# Patient Record
Sex: Female | Born: 1990 | Race: White | Hispanic: No
Health system: Southern US, Academic
[De-identification: ages and names within clinical notes are randomized; demographics above are authoritative.]

## PROBLEM LIST (undated history)

## (undated) ENCOUNTER — Telehealth
Attending: Student in an Organized Health Care Education/Training Program | Primary: Student in an Organized Health Care Education/Training Program

## (undated) ENCOUNTER — Ambulatory Visit

## (undated) ENCOUNTER — Telehealth

## (undated) ENCOUNTER — Ambulatory Visit: Payer: PRIVATE HEALTH INSURANCE

## (undated) ENCOUNTER — Telehealth: Attending: Family Medicine | Primary: Family Medicine

## (undated) ENCOUNTER — Telehealth
Attending: Pharmacist Clinician (PhC)/ Clinical Pharmacy Specialist | Primary: Pharmacist Clinician (PhC)/ Clinical Pharmacy Specialist

## (undated) ENCOUNTER — Ambulatory Visit
Payer: PRIVATE HEALTH INSURANCE | Attending: Student in an Organized Health Care Education/Training Program | Primary: Student in an Organized Health Care Education/Training Program

## (undated) ENCOUNTER — Encounter

## (undated) ENCOUNTER — Encounter
Attending: Student in an Organized Health Care Education/Training Program | Primary: Student in an Organized Health Care Education/Training Program

## (undated) ENCOUNTER — Ambulatory Visit: Payer: MEDICAID

## (undated) ENCOUNTER — Ambulatory Visit: Payer: Medicaid (Managed Care)

## (undated) DIAGNOSIS — Z789 Other specified health status: Secondary | ICD-10-CM

## (undated) DIAGNOSIS — O119 Pre-existing hypertension with pre-eclampsia, unspecified trimester: Secondary | ICD-10-CM

## (undated) HISTORY — DX: Pre-existing hypertension with pre-eclampsia, unspecified trimester: O11.9

---
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
## 2011-08-18 HISTORY — PX: WISDOM TOOTH EXTRACTION: SHX21

## 2013-01-15 ENCOUNTER — Emergency Department: Payer: Self-pay | Admitting: Psychiatry

## 2014-09-06 ENCOUNTER — Emergency Department: Payer: Self-pay | Admitting: Emergency Medicine

## 2014-10-16 ENCOUNTER — Emergency Department: Payer: Self-pay | Admitting: Emergency Medicine

## 2015-08-18 NOTE — L&D Delivery Note (Signed)
Delivery Note  First Stage: Labor induction: Cervidil on 08/15/16 for pre-eclampsia  Labor onset: 08/16/16: 0630 Augmentation: none Analgesia Eliezer Lofts/Anesthesia intrapartum: Epidural  SROM at 0620  Second Stage: Complete dilation at 1211 Onset of pushing at 1216 FHR second stage: Category 2, Baseline: 130 bpm/ moderate variability/ +accels/ variable decels to 90 bpm with each contraction and good return to baseline   Delivery of a viable female "Sohpia" at 1257 by Carlean JewsMeredith Jameer Storie, CNM in ROA position, no nuchal cord - the anterior shoulder easily delivered, followed by the posterior shoulder and body.  Baby was brought to mom's abdomen vigorous and crying, and dried on mom's abdomen. Small amount of terminal meconium noted.  Cord double clamped after cessation of pulsation, cut by FOB Cord blood sample collected  Third Stage: Placenta delivered via Tomasa BlaseSchultz intact with 3 VC @ 1318 Placenta disposition: hospital disposal  Uterine tone firm with massage - IV Pitocin 500mL bolus infusing / bleeding brisk with a small trickle - assessed for cervical laceration and none identified - 2 medium clots expressed and trickle resolved   Right labial laceration identified, 1 interrupted suture placed with good hemostasis  Anesthesia for repair: Epidural, 1% Lidocaine 30mL Repair: 3.0  Est. Blood Loss (mL): 400mL  Complications: none  Mom to postpartum.  Baby to Couplet care / Skin to Skin.  Newborn: Birth Weight: 7#4oz Apgar Scores: 8, 9 Feeding planned: Formula   Carlean JewsMeredith Kian Gamarra, CNM

## 2016-02-25 LAB — OB RESULTS CONSOLE VARICELLA ZOSTER ANTIBODY, IGG: VARICELLA IGG: IMMUNE

## 2016-02-25 LAB — OB RESULTS CONSOLE RPR: RPR: NONREACTIVE

## 2016-02-25 LAB — OB RESULTS CONSOLE HEPATITIS B SURFACE ANTIGEN: Hepatitis B Surface Ag: NEGATIVE

## 2016-02-25 LAB — OB RESULTS CONSOLE HIV ANTIBODY (ROUTINE TESTING): HIV: NONREACTIVE

## 2016-02-25 LAB — OB RESULTS CONSOLE RUBELLA ANTIBODY, IGM: RUBELLA: IMMUNE

## 2016-02-27 ENCOUNTER — Other Ambulatory Visit: Payer: Self-pay | Admitting: Obstetrics and Gynecology

## 2016-02-27 DIAGNOSIS — Z369 Encounter for antenatal screening, unspecified: Secondary | ICD-10-CM

## 2016-03-16 ENCOUNTER — Other Ambulatory Visit: Payer: Self-pay | Admitting: Obstetrics and Gynecology

## 2016-03-16 ENCOUNTER — Ambulatory Visit
Admission: RE | Admit: 2016-03-16 | Discharge: 2016-03-16 | Disposition: A | Discharge status: Home / Self Care | Payer: Medicaid Other | Source: Ambulatory Visit | Attending: Obstetrics and Gynecology | Admitting: Obstetrics and Gynecology

## 2016-03-16 ENCOUNTER — Ambulatory Visit (HOSPITAL_BASED_OUTPATIENT_CLINIC_OR_DEPARTMENT_OTHER)
Admission: RE | Admit: 2016-03-16 | Discharge: 2016-03-16 | Disposition: A | Discharge status: Home / Self Care | Payer: Medicaid Other | Source: Ambulatory Visit | Attending: Obstetrics and Gynecology | Admitting: Obstetrics and Gynecology

## 2016-03-16 DIAGNOSIS — Z3A17 17 weeks gestation of pregnancy: Secondary | ICD-10-CM | POA: Diagnosis not present

## 2016-03-16 DIAGNOSIS — Z3492 Encounter for supervision of normal pregnancy, unspecified, second trimester: Secondary | ICD-10-CM

## 2016-03-16 DIAGNOSIS — O99311 Alcohol use complicating pregnancy, first trimester: Secondary | ICD-10-CM | POA: Insufficient documentation

## 2016-03-16 DIAGNOSIS — Z369 Encounter for antenatal screening, unspecified: Secondary | ICD-10-CM

## 2016-03-16 DIAGNOSIS — F17200 Nicotine dependence, unspecified, uncomplicated: Secondary | ICD-10-CM

## 2016-03-16 DIAGNOSIS — Z720 Tobacco use: Secondary | ICD-10-CM

## 2016-03-16 DIAGNOSIS — F1990 Other psychoactive substance use, unspecified, uncomplicated: Secondary | ICD-10-CM

## 2016-03-16 HISTORY — DX: Other specified health status: Z78.9

## 2016-03-16 NOTE — Progress Notes (Addendum)
Referring physician:  [facility] Length of Consultation: 45 minutes   Sally Kelley  was referred to [facility] for genetic counseling to review prenatal screening and testing options.  This note summarizes the information we discussed.    We offered the following routine screening tests for this pregnancy:  First trimester screening, which includes nuchal translucency ultrasound screen and first trimester maternal serum marker screening.  The nuchal translucency has approximately an 80% detection rate for Down syndrome and can be positive for other chromosome abnormalities as well as congenital heart defects.  When combined with a maternal serum marker screening, the detection rate is up to 90% for Down syndrome and up to 97% for trisomy 18.     Maternal serum marker screening, a blood test that measures pregnancy proteins, can provide risk assessments for Down syndrome, trisomy 18, and open neural tube defects (spina bifida, anencephaly). Because it does not directly examine the fetus, it cannot positively diagnose or rule out these problems.  Targeted ultrasound uses high frequency sound waves to create an image of the developing fetus.  An ultrasound is often recommended as a routine means of evaluating the pregnancy.  It is also used to screen for fetal anatomy problems (for example, a heart defect) that might be suggestive of a chromosomal or other abnormality.   Should these screening tests indicate an increased concern, then the following additional testing options would be offered:  The chorionic villus sampling procedure is available for first trimester chromosome analysis.  This involves the withdrawal of a small amount of chorionic villi (tissue from the developing placenta).  Risk of pregnancy loss is estimated to be approximately 1 in 200 to 1 in 100 (0.5 to 1%).  There is approximately a 1% (1 in 100) chance that the CVS chromosome results will be unclear.   Chorionic villi cannot be tested for neural tube defects.     Amniocentesis involves the removal of a small amount of amniotic fluid from the sac surrounding the fetus with the use of a thin needle inserted through the maternal abdomen and uterus.  Ultrasound guidance is used throughout the procedure.  Fetal cells from amniotic fluid are directly evaluated and > 99.5% of chromosome problems and > 98% of open neural tube defects can be detected. This procedure is generally performed after the 15th week of pregnancy.  The main risks to this procedure include complications leading to miscarriage in less than 1 in 200 cases (0.5%).  As another option for information if the pregnancy is suspected to be an an increased chance for certain chromosome conditions, we also reviewed the availability of cell free fetal DNA testing from maternal blood to determine whether or not the baby may have either Down syndrome, trisomy 7, or trisomy 29.  This test utilizes a maternal blood sample and DNA sequencing technology to isolate circulating cell free fetal DNA from maternal plasma.  The fetal DNA can then be analyzed for DNA sequences that are derived from the three most common chromosomes involved in aneuploidy, chromosomes 13, 18, and 21.  If the overall amount of DNA is greater than the expected level for any of these chromosomes, aneuploidy is suspected.  While we do not consider it a replacement for invasive testing and karyotype analysis, a negative result from this testing would be reassuring, though not a guarantee of a normal chromosome complement for the baby.  An abnormal result is certainly suggestive of an abnormal chromosome complement, though  we would still recommend CVS or amniocentesis to confirm any findings from this testing.  Cystic Fibrosis and Spinal Muscular Atrophy (SMA) screening were also discussed with the patient. Both conditions are recessive, which means that both parents must be carriers in  order to have a child with the disease.  Cystic fibrosis (CF) is one of the most common genetic conditions in persons of Caucasian ancestry.  This condition occurs in approximately 1 in 2,500 Caucasian persons and results in thickened secretions in the lungs, digestive, and reproductive systems.  For a baby to be at risk for having CF, both of the parents must be carriers for this condition.  Approximately 1 in 88 Caucasian persons is a carrier for CF.  Current carrier testing looks for the most common mutations in the gene for CF and can detect approximately 90% of carriers in the Caucasian population.  This means that the carrier screening can greatly reduce, but cannot eliminate, the chance for an individual to have a child with CF.  If an individual is found to be a carrier for CF, then carrier testing would be available for the partner. As part of [place] newborn screening profile, all babies born in the state of [place] will have a two-tier screening process.  Specimens are first tested to determine the concentration of immunoreactive trypsinogen (IRT).  The top 5% of specimens with the highest IRT values then undergo DNA testing using a panel of over 40 common CF mutations. SMA is a neurodegenerative disorder that leads to atrophy of skeletal muscle and overall weakness.  This condition is also more prevalent in the Caucasian population, with 1 in 40-1 in 60 persons being a carrier and 1 in 6,000-1 in 10,000 children being affected.  There are multiple forms of the disease, with some causing death in infancy to other forms with survival into adulthood.  The genetics of SMA is complex, but carrier screening can detect up to 95% of carriers in the Caucasian population.  Similar to CF, a negative result can greatly reduce, but cannot eliminate, the chance to have a child with SMA.  We obtained a detailed family history and pregnancy history.  The family history was reported to be unremarkable  for birth defects, mental retardation, recurrent pregnancy loss or known chromosome abnormalities.  Sally Kelley stated that this is her second pregnancy.  The first was terminated due to personal reasons.  She reported no complications in the current pregnancy.  However, she did report drinking alcohol and smoking at least once per week and smoking marijuana multiple times each week.  Alcohol consumption during pregnancy has been associated with a number of birth defects including growth delays, small head size, heart defects, eye anomalies and facial differences as well as learning disabilities and behavioral problems.  The risk of these to occur tends to increase with the amount of alcohol consumed, however, malformations have been seen with as little as two drinks per day.  Because there is no safe amount of alcohol consumption during pregnancy, we suggest women completely avoid alcohol while pregnant.  A level 2 ultrasound and fetal echocardiogram (a detailed ultrasound of the fetal heart after 20 weeks) may help to detect growth delays or birth defects associated with alcohol use.  However, it is important to remember that not all birth defects can be identified prenatally. The use of marijuana in pregnancy is known to be associated with low birth weight and premature delivery.  We therefore suggested the patient avoid  smoking marijuana during this time. We also discussed that smoking during pregnancy has been associated with low birth weight, premature delivery and pregnancy loss.  For this reason, we suggest that she cut back or avoid smoking cigarettes for the remainder of the pregnancy.  After consideration of the options, Sally Kelley elected to proceed with first trimester screening.  However, the ultrasound revealed her to be [redacted] weeks gestation.  She then elected to proceed with maternal serum screening.  An ultrasound was performed at the time of the visit.  The gestational age was consistent with   72 weeks.  Routine fetal anatomy was seen and appeared normal except for the posterior fossa and spine which were suboptimally seen.  Please refer to the ultrasound report for details of that study.  Sally Kelley was encouraged to call with questions or concerns.  We can be contacted at [phone].   Wilburt Finlay, MS, CGC   I met with Ms Sally Kelley and reviewed that visualized anatomy was normal, I scheduled an additional scan to complete the anatomy checklist . Patient seemed motivated to eliminate her substance use and is relieved that a healthy pregnancy is seen . I reviewed that not all problems can be seen on ultrasound.  Gatha Mayer, MD

## 2016-03-19 ENCOUNTER — Telehealth: Payer: Self-pay | Admitting: Obstetrics and Gynecology

## 2016-03-19 NOTE — Telephone Encounter (Signed)
Ms. Sally Kelley elected to proceed with maternal serum screening at her visit with [facility] of Monday, 03/16/2016.  The results are normal with a chance of 1 in 10,000 for open neural tube defect; 1 in 4,316 for Down syndrome and 1 in 3,847 for trisomy 18.  The patient was informed of these results.  Cherly Anderson, MS, CGC

## 2016-04-13 ENCOUNTER — Ambulatory Visit: Payer: Medicaid Other

## 2016-04-29 LAB — AFP, QUAD SCREEN
AFP MoM: 1.05
AFP: 43.7 ng/mL
DIA MOM VALUE: 1.37
DIA VALUE (EIA): 234.5 pg/mL
DSR (BY AGE) 1 IN: 987
Down Syndrome Scr Risk Est: 4316
Gest. Age on Collection Date: 17.9 WEEKS
HCG MOM: 0.95
HCG TOTAL: 26796 m[IU]/mL
MATERNAL AGE AT EDD: 25.9 a
MATERNAL WT: 155 [lb_av]
OSB INTERPRETATION: 10000
Test Results:: NEGATIVE
Tri 18 Scr Risk Est: 1:3847 {titer}
UE3 MOM: 1.31
UE3 VALUE: 1.68 ng/mL

## 2016-05-04 ENCOUNTER — Ambulatory Visit
Admission: RE | Admit: 2016-05-04 | Discharge: 2016-05-04 | Disposition: A | Discharge status: Home / Self Care | Payer: Medicaid Other | Source: Ambulatory Visit | Attending: Maternal and Fetal Medicine | Admitting: Maternal and Fetal Medicine

## 2016-05-04 DIAGNOSIS — Z36 Encounter for antenatal screening of mother: Secondary | ICD-10-CM | POA: Insufficient documentation

## 2016-05-04 DIAGNOSIS — O99332 Smoking (tobacco) complicating pregnancy, second trimester: Secondary | ICD-10-CM | POA: Insufficient documentation

## 2016-05-04 DIAGNOSIS — O99311 Alcohol use complicating pregnancy, first trimester: Secondary | ICD-10-CM

## 2016-05-04 DIAGNOSIS — Z3A24 24 weeks gestation of pregnancy: Secondary | ICD-10-CM | POA: Insufficient documentation

## 2016-05-04 DIAGNOSIS — F1990 Other psychoactive substance use, unspecified, uncomplicated: Secondary | ICD-10-CM

## 2016-05-04 DIAGNOSIS — F17200 Nicotine dependence, unspecified, uncomplicated: Secondary | ICD-10-CM

## 2016-07-31 LAB — OB RESULTS CONSOLE GC/CHLAMYDIA
Chlamydia: NEGATIVE
Gonorrhea: NEGATIVE

## 2016-07-31 LAB — OB RESULTS CONSOLE GBS: GBS: NEGATIVE

## 2016-08-14 ENCOUNTER — Inpatient Hospital Stay
Admission: RE | Admit: 2016-08-14 | Discharge: 2016-08-17 | DRG: 775 | Disposition: A | Discharge status: Home / Self Care | Payer: Medicaid Other | Source: Ambulatory Visit | Attending: Obstetrics and Gynecology | Admitting: Obstetrics and Gynecology

## 2016-08-14 DIAGNOSIS — F17210 Nicotine dependence, cigarettes, uncomplicated: Secondary | ICD-10-CM | POA: Diagnosis present

## 2016-08-14 DIAGNOSIS — O1404 Mild to moderate pre-eclampsia, complicating childbirth: Principal | ICD-10-CM | POA: Diagnosis present

## 2016-08-14 DIAGNOSIS — O99334 Smoking (tobacco) complicating childbirth: Secondary | ICD-10-CM | POA: Diagnosis present

## 2016-08-14 DIAGNOSIS — O1493 Unspecified pre-eclampsia, third trimester: Secondary | ICD-10-CM | POA: Diagnosis present

## 2016-08-14 DIAGNOSIS — F1290 Cannabis use, unspecified, uncomplicated: Secondary | ICD-10-CM | POA: Diagnosis present

## 2016-08-14 DIAGNOSIS — O99324 Drug use complicating childbirth: Secondary | ICD-10-CM | POA: Diagnosis present

## 2016-08-14 DIAGNOSIS — Z3A39 39 weeks gestation of pregnancy: Secondary | ICD-10-CM

## 2016-08-14 LAB — COMPREHENSIVE METABOLIC PANEL
ALT: 10 U/L — ABNORMAL LOW (ref 14–54)
ANION GAP: 10 (ref 5–15)
AST: 21 U/L (ref 15–41)
Albumin: 3 g/dL — ABNORMAL LOW (ref 3.5–5.0)
Alkaline Phosphatase: 113 U/L (ref 38–126)
BILIRUBIN TOTAL: 0.4 mg/dL (ref 0.3–1.2)
BUN: 10 mg/dL (ref 6–20)
CHLORIDE: 103 mmol/L (ref 101–111)
CO2: 20 mmol/L — ABNORMAL LOW (ref 22–32)
Calcium: 9.4 mg/dL (ref 8.9–10.3)
Creatinine, Ser: 0.49 mg/dL (ref 0.44–1.00)
Glucose, Bld: 100 mg/dL — ABNORMAL HIGH (ref 65–99)
POTASSIUM: 3.5 mmol/L (ref 3.5–5.1)
Sodium: 133 mmol/L — ABNORMAL LOW (ref 135–145)
TOTAL PROTEIN: 6.6 g/dL (ref 6.5–8.1)

## 2016-08-14 LAB — CBC
HEMATOCRIT: 36.3 % (ref 35.0–47.0)
HEMOGLOBIN: 12.7 g/dL (ref 12.0–16.0)
MCH: 31.7 pg (ref 26.0–34.0)
MCHC: 35 g/dL (ref 32.0–36.0)
MCV: 90.5 fL (ref 80.0–100.0)
Platelets: 243 10*3/uL (ref 150–440)
RBC: 4.01 MIL/uL (ref 3.80–5.20)
RDW: 13.6 % (ref 11.5–14.5)
WBC: 12.8 10*3/uL — AB (ref 3.6–11.0)

## 2016-08-14 MED ORDER — HYDRALAZINE HCL 20 MG/ML IJ SOLN: 10.0000 mg | Freq: Once | INTRAMUSCULAR | Status: DC | PRN

## 2016-08-14 MED ORDER — LABETALOL HCL 5 MG/ML IV SOLN
20.0000 mg | INTRAVENOUS | Status: DC | PRN
Start: 2016-08-14 — End: 2016-08-17
  Filled 2016-08-14: qty 16

## 2016-08-14 NOTE — H&P (Signed)
Sally Kelley is a 25 y.o. female G2P0010 at 39+2 weeks presenting for iol for PreE without severe features.  Patient's last menstrual period was 12/11/2015 (exact date). Estimated Date of Delivery: 08/19/16  Patient concerns today:  Denies HA SOB change vision and RUQ/epigastric pain.  Pt denies vaginal bleeding, leaking fluid, decreased fetal movement.   BP (!) 128/92   Pulse 85   Ht 162.6 cm (5' 4.02")   Wt 78.6 kg (173 lb 3.2 oz)   LMP 12/11/2015 (Exact Date)   BMI 29.71 kg/m   OB History    Gravida Para Term Preterm AB Living   2       1 1    SAB TAB Ectopic Multiple Live Births                 Past Medical History:  Diagnosis Date  . Medical history non-contributory    Past Surgical History:  Procedure Laterality Date  . INDUCED ABORTION  2011  . WISDOM TOOTH EXTRACTION Left 2013   Family History: family history is not on file. Social History:  reports that she has been smoking Cigarettes.  She has been smoking about 0.25 packs per day. She has never used smokeless tobacco. She reports that she uses drugs, including Marijuana. She reports that she does not drink alcohol.     Maternal Diabetes: No Genetic Screening: Normal Maternal Ultrasounds/Referrals: Normal Fetal Ultrasounds or other Referrals:  None Maternal Substance Abuse:  No Significant Maternal Medications:  None Significant Maternal Lab Results:  None Other Comments:  None  Review of Systems  Constitutional: Negative for chills and fever.  Eyes: Negative for blurred vision and double vision.  Respiratory: Negative for shortness of breath.   Cardiovascular: Negative for chest pain and palpitations.  Gastrointestinal: Negative for abdominal pain, constipation, diarrhea, nausea and vomiting.  Genitourinary: Negative for dysuria, flank pain, frequency and urgency.  Neurological: Negative for headaches.  Psychiatric/Behavioral: Negative for depression.   Maternal Medical History:  Reason for  admission: Nausea.      Blood pressure (!) 141/87, pulse 95, temperature 98.6 F (37 C), temperature source Oral, resp. rate 18, SpO2 99 %. Exam Physical Exam  Constitutional: She is oriented to person, place, and time. She appears well-developed and well-nourished. No distress.  Eyes: No scleral icterus.  Neck: Normal range of motion. Neck supple.  Cardiovascular: Normal rate.   Respiratory: Effort normal. No respiratory distress.  GI: Soft. She exhibits no distension. There is no tenderness.  Genitourinary: Vagina normal and uterus normal.  Musculoskeletal: Normal range of motion.  Neurological: She is alert and oriented to person, place, and time.  Skin: Skin is warm and dry.  Psychiatric: She has a normal mood and affect.    Prenatal labs: MBT O+ Ab screen Neg Pap HIV Neg Hep B/RPR Neg/NR Rubella Immune VZV Immune  Assessment/Plan:  25 y.o. G2P0010 AT 2744w2d with PreE with mild features Explained to patient that she has preeclampsia, with 2 >90 DBP >4hrs apart and PC ratio of >0.3 (.9).   - Admit to hospital for serial blood pressures in motherbaby floor and then IOL when a room on L&D becomes available. - serial BP - PreE labs pending - Fetal monitoring per shift - to L&D when available. Plan for pitocin as cervix already 2-3 cm; recheck on transfer. - severe range pressures: treat with iv labetalol per protocol. Orders in     [place]waterBEASLEY, Sally Irion 08/14/2016, 7:26 PM

## 2016-08-15 DIAGNOSIS — F17210 Nicotine dependence, cigarettes, uncomplicated: Secondary | ICD-10-CM | POA: Diagnosis present

## 2016-08-15 DIAGNOSIS — O99324 Drug use complicating childbirth: Secondary | ICD-10-CM | POA: Diagnosis present

## 2016-08-15 DIAGNOSIS — F1290 Cannabis use, unspecified, uncomplicated: Secondary | ICD-10-CM | POA: Diagnosis present

## 2016-08-15 DIAGNOSIS — Z3A39 39 weeks gestation of pregnancy: Secondary | ICD-10-CM | POA: Diagnosis not present

## 2016-08-15 DIAGNOSIS — O99334 Smoking (tobacco) complicating childbirth: Secondary | ICD-10-CM | POA: Diagnosis present

## 2016-08-15 DIAGNOSIS — O1404 Mild to moderate pre-eclampsia, complicating childbirth: Secondary | ICD-10-CM | POA: Diagnosis present

## 2016-08-15 DIAGNOSIS — O1494 Unspecified pre-eclampsia, complicating childbirth: Secondary | ICD-10-CM | POA: Diagnosis present

## 2016-08-15 LAB — BASIC METABOLIC PANEL
Anion gap: 10 (ref 5–15)
BUN: 9 mg/dL (ref 6–20)
CALCIUM: 9 mg/dL (ref 8.9–10.3)
CO2: 21 mmol/L — AB (ref 22–32)
CREATININE: 0.65 mg/dL (ref 0.44–1.00)
Chloride: 104 mmol/L (ref 101–111)
GFR calc Af Amer: >60 mL/min (ref >60)
GFR calc non Af Amer: >60 mL/min (ref >60)
GLUCOSE: 105 mg/dL — AB (ref 65–99)
Potassium: 3.3 mmol/L — ABNORMAL LOW (ref 3.5–5.1)
Sodium: 135 mmol/L (ref 135–145)

## 2016-08-15 LAB — PROTEIN / CREATININE RATIO, URINE
CREATININE, URINE: 68 mg/dL
PROTEIN CREATININE RATIO: 0.21 mg/mg{creat} — AB (ref 0.00–0.15)
TOTAL PROTEIN, URINE: 14 mg/dL

## 2016-08-15 MED ORDER — ZOLPIDEM TARTRATE 5 MG PO TABS
5.0000 mg | ORAL_TABLET | Freq: Every evening | ORAL | Status: DC | PRN
  Administered 2016-08-15: 5 mg via ORAL
  Filled 2016-08-15: qty 1

## 2016-08-15 MED ORDER — SODIUM CHLORIDE FLUSH 0.9 % IV SOLN
INTRAVENOUS | Status: AC
  Administered 2016-08-15: 21:00:00
  Filled 2016-08-15: qty 20

## 2016-08-15 MED ORDER — LACTATED RINGERS IV SOLN
INTRAVENOUS | Status: DC
  Administered 2016-08-16: 06:00:00 via INTRAVENOUS

## 2016-08-15 MED ORDER — DINOPROSTONE 10 MG VA INST
10.0000 mg | VAGINAL_INSERT | Freq: Once | VAGINAL | Status: AC
  Administered 2016-08-15: 10 mg via VAGINAL
  Filled 2016-08-15: qty 1

## 2016-08-15 NOTE — Progress Notes (Signed)
Patient ID: [name], female   DOB: 01/24/1991, 25 y.o.   MRN: [id number] Pt with Mild preeclampsia and is scheduled for induction on L+D . All rooms full . Pt with scotomata or h/a this am .  O: 119/69 Lungs cat  cv rrr  A; PIH  P: induction today well room opens  Light meal before going over

## 2016-08-15 NOTE — Progress Notes (Signed)
Will bring to L+D tonight for cervidil induction  VSS stable  Repeat labs in am

## 2016-08-16 ENCOUNTER — Inpatient Hospital Stay: Payer: Medicaid Other | Admitting: Anesthesiology

## 2016-08-16 ENCOUNTER — Encounter: Payer: Self-pay | Admitting: Obstetrics and Gynecology

## 2016-08-16 DIAGNOSIS — O1493 Unspecified pre-eclampsia, third trimester: Secondary | ICD-10-CM | POA: Diagnosis present

## 2016-08-16 LAB — CBC
HEMATOCRIT: 36.3 % (ref 35.0–47.0)
HEMOGLOBIN: 12.8 g/dL (ref 12.0–16.0)
MCH: 31.9 pg (ref 26.0–34.0)
MCHC: 35.3 g/dL (ref 32.0–36.0)
MCV: 90.6 fL (ref 80.0–100.0)
Platelets: 230 10*3/uL (ref 150–440)
RBC: 4.01 MIL/uL (ref 3.80–5.20)
RDW: 13.4 % (ref 11.5–14.5)
WBC: 12.3 10*3/uL — ABNORMAL HIGH (ref 3.6–11.0)

## 2016-08-16 LAB — URINE DRUG SCREEN, QUALITATIVE (ARMC ONLY)
Amphetamines, Ur Screen: NOT DETECTED
BARBITURATES, UR SCREEN: NOT DETECTED
BENZODIAZEPINE, UR SCRN: NOT DETECTED
Cannabinoid 50 Ng, Ur ~~LOC~~: POSITIVE — AB
Cocaine Metabolite,Ur ~~LOC~~: NOT DETECTED
MDMA (Ecstasy)Ur Screen: NOT DETECTED
METHADONE SCREEN, URINE: NOT DETECTED
OPIATE, UR SCREEN: NOT DETECTED
PHENCYCLIDINE (PCP) UR S: NOT DETECTED
Tricyclic, Ur Screen: NOT DETECTED

## 2016-08-16 LAB — TYPE AND SCREEN
ABO/RH(D): O POS
Antibody Screen: NEGATIVE

## 2016-08-16 MED ORDER — ZOLPIDEM TARTRATE 5 MG PO TABS: 5.0000 mg | ORAL_TABLET | Freq: Every evening | ORAL | Status: DC | PRN

## 2016-08-16 MED ORDER — ACETAMINOPHEN 325 MG PO TABS: 650.0000 mg | ORAL_TABLET | ORAL | Status: DC | PRN

## 2016-08-16 MED ORDER — BUTORPHANOL TARTRATE 1 MG/ML IJ SOLN: 1.0000 mg | INTRAMUSCULAR | Status: DC | PRN

## 2016-08-16 MED ORDER — OXYTOCIN BOLUS FROM INFUSION
500.0000 mL | Freq: Once | INTRAVENOUS | Status: AC
  Administered 2016-08-16: 500 mL via INTRAVENOUS

## 2016-08-16 MED ORDER — OXYCODONE-ACETAMINOPHEN 5-325 MG PO TABS: 2.0000 | ORAL_TABLET | ORAL | Status: DC | PRN

## 2016-08-16 MED ORDER — PRENATAL MULTIVITAMIN CH
1.0000 | ORAL_TABLET | Freq: Every day | ORAL | Status: DC
  Administered 2016-08-16 – 2016-08-17 (×2): 1 via ORAL
  Filled 2016-08-16 (×3): qty 1

## 2016-08-16 MED ORDER — FERROUS SULFATE 325 (65 FE) MG PO TABS
325.0000 mg | ORAL_TABLET | Freq: Two times a day (BID) | ORAL | Status: DC
  Administered 2016-08-16 – 2016-08-17 (×2): 325 mg via ORAL
  Filled 2016-08-16 (×2): qty 1

## 2016-08-16 MED ORDER — DIBUCAINE 1 % RE OINT: 1.0000 "application " | TOPICAL_OINTMENT | RECTAL | Status: DC | PRN

## 2016-08-16 MED ORDER — LIDOCAINE-EPINEPHRINE (PF) 1.5 %-1:200000 IJ SOLN
INTRAMUSCULAR | Status: DC | PRN
  Administered 2016-08-16: 3 mL via EPIDURAL

## 2016-08-16 MED ORDER — ONDANSETRON HCL 4 MG/2ML IJ SOLN
4.0000 mg | Freq: Four times a day (QID) | INTRAMUSCULAR | Status: DC | PRN
  Administered 2016-08-16: 4 mg via INTRAVENOUS
  Filled 2016-08-16: qty 2

## 2016-08-16 MED ORDER — LACTATED RINGERS IV SOLN
INTRAVENOUS | Status: DC
  Administered 2016-08-16: 11:00:00 via INTRAVENOUS

## 2016-08-16 MED ORDER — ONDANSETRON HCL 4 MG PO TABS: 4.0000 mg | ORAL_TABLET | ORAL | Status: DC | PRN

## 2016-08-16 MED ORDER — OXYTOCIN 40 UNITS IN LACTATED RINGERS INFUSION - SIMPLE MED
INTRAVENOUS | Status: AC
  Administered 2016-08-16: 500 mL via INTRAVENOUS
  Filled 2016-08-16: qty 1000

## 2016-08-16 MED ORDER — DIPHENHYDRAMINE HCL 25 MG PO CAPS: 25.0000 mg | ORAL_CAPSULE | Freq: Four times a day (QID) | ORAL | Status: DC | PRN

## 2016-08-16 MED ORDER — LIDOCAINE HCL (PF) 1 % IJ SOLN
30.0000 mL | INTRAMUSCULAR | Status: AC | PRN
  Administered 2016-08-16: .8 mL via SUBCUTANEOUS

## 2016-08-16 MED ORDER — IBUPROFEN 600 MG PO TABS
600.0000 mg | ORAL_TABLET | Freq: Four times a day (QID) | ORAL | Status: DC
  Administered 2016-08-16 – 2016-08-17 (×4): 600 mg via ORAL
  Filled 2016-08-16 (×4): qty 1

## 2016-08-16 MED ORDER — OXYTOCIN 10 UNIT/ML IJ SOLN
INTRAMUSCULAR | Status: DC
Start: 2016-08-16 — End: 2016-08-16
  Filled 2016-08-16: qty 2

## 2016-08-16 MED ORDER — SENNOSIDES-DOCUSATE SODIUM 8.6-50 MG PO TABS
2.0000 | ORAL_TABLET | ORAL | Status: DC
  Administered 2016-08-17: 2 via ORAL
  Filled 2016-08-16: qty 2

## 2016-08-16 MED ORDER — SODIUM CHLORIDE 0.9 % IV SOLN
INTRAVENOUS | Status: DC | PRN
  Administered 2016-08-16: 10 mL via EPIDURAL

## 2016-08-16 MED ORDER — WITCH HAZEL-GLYCERIN EX PADS: 1.0000 "application " | MEDICATED_PAD | CUTANEOUS | Status: DC | PRN

## 2016-08-16 MED ORDER — EPHEDRINE 5 MG/ML INJ
10.0000 mg | INTRAVENOUS | Status: DC | PRN
  Filled 2016-08-16: qty 2

## 2016-08-16 MED ORDER — SIMETHICONE 80 MG PO CHEW: 80.0000 mg | CHEWABLE_TABLET | ORAL | Status: DC | PRN

## 2016-08-16 MED ORDER — OXYCODONE-ACETAMINOPHEN 5-325 MG PO TABS: 1.0000 | ORAL_TABLET | ORAL | Status: DC | PRN

## 2016-08-16 MED ORDER — LIDOCAINE HCL (PF) 1 % IJ SOLN
INTRAMUSCULAR | Status: AC
  Filled 2016-08-16: qty 30

## 2016-08-16 MED ORDER — OXYTOCIN 40 UNITS IN LACTATED RINGERS INFUSION - SIMPLE MED: 2.5000 [IU]/h | INTRAVENOUS | Status: DC

## 2016-08-16 MED ORDER — ONDANSETRON HCL 4 MG/2ML IJ SOLN: 4.0000 mg | INTRAMUSCULAR | Status: DC | PRN

## 2016-08-16 MED ORDER — COCONUT OIL OIL: 1.0000 "application " | TOPICAL_OIL | Status: DC | PRN

## 2016-08-16 MED ORDER — SOD CITRATE-CITRIC ACID 500-334 MG/5ML PO SOLN: 30.0000 mL | ORAL | Status: DC | PRN

## 2016-08-16 MED ORDER — LACTATED RINGERS IV SOLN: 500.0000 mL | Freq: Once | INTRAVENOUS | Status: DC

## 2016-08-16 MED ORDER — DIPHENHYDRAMINE HCL 50 MG/ML IJ SOLN: 12.5000 mg | INTRAMUSCULAR | Status: DC | PRN

## 2016-08-16 MED ORDER — BENZOCAINE-MENTHOL 20-0.5 % EX AERO
1.0000 "application " | INHALATION_SPRAY | Freq: Three times a day (TID) | CUTANEOUS | Status: DC
  Administered 2016-08-16 – 2016-08-17 (×2): 1 via TOPICAL
  Filled 2016-08-16 (×2): qty 56

## 2016-08-16 MED ORDER — PHENYLEPHRINE 40 MCG/ML (10ML) SYRINGE FOR IV PUSH (FOR BLOOD PRESSURE SUPPORT)
80.0000 ug | PREFILLED_SYRINGE | INTRAVENOUS | Status: DC | PRN
  Filled 2016-08-16: qty 5

## 2016-08-16 MED ORDER — AMMONIA AROMATIC IN INHA
RESPIRATORY_TRACT | Status: AC
  Filled 2016-08-16: qty 10

## 2016-08-16 MED ORDER — LACTATED RINGERS IV SOLN
INTRAVENOUS | Status: DC
  Administered 2016-08-16: 23:00:00 via INTRAVENOUS

## 2016-08-16 MED ORDER — LACTATED RINGERS IV SOLN: 500.0000 mL | INTRAVENOUS | Status: DC | PRN

## 2016-08-16 MED ORDER — FENTANYL 2.5 MCG/ML W/ROPIVACAINE 0.2% IN NS 100 ML EPIDURAL INFUSION (ARMC-ANES)
10.0000 mL/h | EPIDURAL | Status: DC
  Filled 2016-08-16: qty 100

## 2016-08-16 MED ORDER — MISOPROSTOL 200 MCG PO TABS
ORAL_TABLET | ORAL | Status: AC
  Filled 2016-08-16: qty 4

## 2016-08-16 NOTE — Progress Notes (Addendum)
Pt up to bathroom, reports Cervidil fell out. Schermerhorn, MD aware, will continue to assess, no new orders.

## 2016-08-16 NOTE — Progress Notes (Signed)
S:  Pt. Feeling pressure, also having nausea/vomiting       Requesting pain medication   O:  VS: Blood pressure 139/88, pulse 87, temperature 97.8 F (36.6 C), temperature source Oral, resp. rate 20, height 5' 4.02" (1.626 m), weight 78.5 kg (173 lb), SpO2 98 %.        FHR : baseline 135 bpm / variability moderate / accelerations + / no decelerations        Toco: contractions every 2-4 minutes / moderate         Cervix : Dilation: 5 Effacement (%): 100 Station: 0 Exam by:: M. Sigmon, CNM        Membranes: SROM - clear fluid  A: Progressing to active labor     FHR category 1     IOL for Mild Pre-eclampsia   P: Epidural upon request      Zofran PRN      Anticipate NSVD  Carlean JewsMeredith Sigmon, CNM

## 2016-08-16 NOTE — Discharge Summary (Signed)
Obstetrical Discharge Summary  Patient Name: Sally BarreKelsey L Kelley DOB: [birth year] MRN: [id number]  Date of Admission: 08/14/2016 Date of Discharge:   Primary OB: [facility]   Gestational Age at Delivery: 4035w4d   Antepartum complications: +THC use, Obesity Admitting Diagnosis: IOL for pre-eclampsia with mild features and proteinuria  Secondary Diagnosis: Patient Active Problem List   Diagnosis Date Noted  . 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 Pre-eclampsia during pregnancy in third trimester, antepartum 08/16/2016  . 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  By time of discharge on PPD#1, her pain was controlled on oral pain medications; she had appropriate lochia and was ambulating, voiding without difficulty and tolerating regular diet.  She was deemed stable for discharge to home.    Discharge Physical Exam:  BP 119/72 (BP Location: Left Arm)   Pulse 84   Temp 98 F (36.7 C) (Oral)   Resp 18   Ht 5' 4.02" (1.626 m)   Wt 173 lb (78.5 kg)   LMP  (LMP Unknown)   SpO2 99%   Breastfeeding? Unknown   BMI 29.68 kg/m   General: NAD CV: RRR Pulm: CTABL, nl effort ABD: s/nd/nt, fundus firm and below the umbilicus Lochia: moderate Perineum: healing well, no significant erythema or edema  DVT Evaluation: LE non-ttp, no evidence of DVT on exam.  Hemoglobin  Date Value Ref Range Status  08/17/2016 10.2 (L) 12.0 - 16.0 g/dL Final    Comment:    RESULT REPEATED AND VERIFIED   HCT  Date  Value Ref Range Status  08/17/2016 28.7 (L) 35.0 - 47.0 % Final     Disposition: stable, discharge to home. Baby Feeding: formula Baby Disposition: home with mom  Rh Immune globulin given: Rubella vaccine given:  Tdap vaccine given in AP or PP setting: 07/08/16 Flu vaccine given in AP or PP setting: 05/28/16  Contraception: Nexplanon   Prenatal Labs:  O Pos Antibody screen Negative Rubella Immune Varicella Immune GBS Negative 1 hour GTT: 125 Hep B negative HIV Negative RPR Negative   Plan:  Sally Kelley was discharged to home in good condition. [facility] week for a BP check, then for 6 week PP visit    Discharge Medications: Allergies as of 08/17/2016   No Known Allergies     Medication List    STOP taking these medications   amoxicillin 250 MG/5ML suspension Commonly known as:  AMOXIL     TAKE these medications   docusate sodium 100 MG capsule Commonly known as:  COLACE Take 1 capsule (100 mg total) by mouth 2 (two) times daily. To keep stools soft   ibuprofen 800 MG tablet Commonly known as:  ADVIL,MOTRIN Take 1 tablet (800 mg total) by mouth every 8 (eight) hours as needed for moderate pain.   promethazine 25 MG tablet Commonly known as:  PHENERGAN Take 25 mg by mouth every 6 (six) hours as needed for nausea or vomiting.       Follow-up Information    Karena AddisonSigmon, Meredith C, CNM Follow up in 1 week(s).   Specialty:  Certified Nurse Midwife Why:  BP check, would like to order a Nexplanon, then for 6 week PP visit Contact information: [address] [facility] [place] [id number] [phone]           Signed: Christeen DouglasBEASLEY, Linwood Gullikson

## 2016-08-16 NOTE — Progress Notes (Signed)
S:  Pt. Comfortable with epidural   O:  VS: Blood pressure 139/88, pulse 87, temperature 97.8 F (36.6 C), temperature source Oral, resp. rate 20, height 5' 4.02" (1.626 m), weight 78.5 kg (173 lb), SpO2 98 %.        FHR : baseline 120 bpm / variability moderate / accelerations + / early decelerations        Toco: contractions every 2-4 minutes / moderate         Cervix : Dilation: 6 Effacement (%): 100 Station: 0 Presentation: Vertex Exam by:: k. yates, rn        Membranes: SROM - clear fluid   A: Active labor     FHR category 2  P: Counseled on +MJ on admission - advised to quit and recommend against breastfeeding while positive - pt. States she was planning on bottle feeding    Continue expectant management    Anticipate NSVD  Carlean JewsMeredith Shawnelle Spoerl, CNM

## 2016-08-16 NOTE — Discharge Instructions (Signed)
Care After Vaginal Delivery °Congratulations on your new baby!! ° °Refer to this sheet in the next few weeks. These discharge instructions provide you with information on caring for yourself after delivery. Your caregiver may also give you specific instructions. Your treatment has been planned according to the most current medical practices available, but problems sometimes occur. Call your caregiver if you have any problems or questions after you go home. ° °HOME CARE INSTRUCTIONS °· Take over-the-counter or prescription medicines only as directed by your caregiver or pharmacist. °· Do not drink alcohol, especially if you are breastfeeding or taking medicine to relieve pain. °· Do not chew or smoke tobacco. °· Do not use illegal drugs. °· Continue to use good perineal care. Good perineal care includes: °¨ Wiping your perineum from front to back. °¨ Keeping your perineum clean. °· Do not use tampons or douche until your caregiver says it is okay. °· Shower, wash your hair, and take tub baths as directed by your caregiver. °· Wear a well-fitting bra that provides breast support. °· Eat healthy foods. °· Drink enough fluids to keep your urine clear or pale yellow. °· Eat high-fiber foods such as whole grain cereals and breads, brown rice, beans, and fresh fruits and vegetables every day. These foods may help prevent or relieve constipation. °· Follow your caregiver's recommendations regarding resumption of activities such as climbing stairs, driving, lifting, exercising, or traveling. Specifically, no driving for two weeks, so that you are comfortable reacting quickly in an emergency. °· Talk to your caregiver about resuming sexual activities. Resumption of sexual activities is dependent upon your risk of infection, your rate of healing, and your comfort and desire to resume sexual activity. Usually we recommend waiting about six weeks, or until your bleeding stops and you are interested in sex. °· Try to have someone  help you with your household activities and your newborn for at least a few days after you leave the hospital. Even longer is better. °· Rest as much as possible. Try to rest or take a nap when your newborn is sleeping. Sleep deprivation can be very hard after delivery. °· Increase your activities gradually. °· Keep all of your scheduled postpartum appointments. It is very important to keep your scheduled follow-up appointments. At these appointments, your caregiver will be checking to make sure that you are healing physically and emotionally. ° °SEEK MEDICAL CARE IF:  °· You are passing large clots from your vagina.  °· You have a foul smelling discharge from your vagina. °· You have trouble urinating. °· You are urinating frequently. °· You have pain when you urinate. °· You have a change in your bowel movements. °· You have increasing redness, pain, or swelling near your vaginal incision (episiotomy) or vaginal tear. °· You have pus draining from your episiotomy or vaginal tear. °· Your episiotomy or vaginal tear is separating. °· You have painful, hard, or reddened breasts. °· You have a severe headache. °· You have blurred vision or see spots. °· You feel sad or depressed. °· You have thoughts of hurting yourself or your newborn. °· You have questions about your care, the care of your newborn, or medicines. °· You are dizzy or light-headed. °· You have a rash. °· You have nausea or vomiting. °· You were breastfeeding and have not had a menstrual period within 12 weeks after you stopped breastfeeding. °· You are not breastfeeding and have not had a menstrual period by the 12th week after delivery. °· You   have a fever. ° °SEEK IMMEDIATE MEDICAL CARE IF:  °· You have persistent pain. °· You have chest pain. °· You have shortness of breath. °· You faint. °· You have leg pain. °· You have stomach pain. °· Your vaginal bleeding saturates two or more sanitary pads in 1 hour. ° °MAKE SURE YOU:  °· Understand these  instructions. °· Will get help right away if you are not doing well or get worse. °·  °Document Released: 07/31/2000 Document Revised: 12/18/2013 Document Reviewed: 03/30/2012 ° °ExitCare® Patient Information ©2015 [employer]. This information is not intended to replace advice given to you by your health care provider. Make sure you discuss any questions you have with your health care provider. ° °

## 2016-08-16 NOTE — Anesthesia Procedure Notes (Signed)
Epidural Patient location during procedure: OB Start time: 08/16/2016 8:45 AM End time: 08/16/2016 9:09 AM  Staffing Performed: anesthesiologist   Preanesthetic Checklist Completed: patient identified, site marked, surgical consent, pre-op evaluation, timeout performed, IV checked, risks and benefits discussed and monitors and equipment checked  Epidural Patient position: sitting Prep: Betadine Patient monitoring: heart rate, continuous pulse ox and blood pressure Approach: midline Location: L4-L5 Injection technique: LOR saline  Needle:  Needle type: Tuohy  Needle gauge: 17 G Needle length: 9 cm and 9 Needle insertion depth: 7 cm Catheter type: closed end flexible Catheter size: 19 Gauge Catheter at skin depth: 10 cm Test dose: negative and 1.5% lidocaine with Epi 1:200 K  Assessment Events: blood not aspirated, injection not painful, no injection resistance, negative IV test and no paresthesia  Additional Notes   Patient tolerated the insertion well without complications.Reason for block:procedure for pain

## 2016-08-16 NOTE — Anesthesia Preprocedure Evaluation (Signed)
Anesthesia Evaluation  Patient identified by MRN, date of birth, ID band Patient awake    Reviewed: Allergy & Precautions, Patient's Chart, lab work & pertinent test results  History of Anesthesia Complications Negative for: history of anesthetic complications  Airway Mallampati: II       Dental   Pulmonary Current Smoker,           Cardiovascular hypertension (mild pre-eclampsia), Pt. on medications      Neuro/Psych negative neurological ROS     GI/Hepatic negative GI ROS, Neg liver ROS,   Endo/Other  negative endocrine ROS  Renal/GU negative Renal ROS     Musculoskeletal   Abdominal   Peds  Hematology   Anesthesia Other Findings   Reproductive/Obstetrics (+) Pregnancy                             Anesthesia Physical Anesthesia Plan  ASA: II  Anesthesia Plan: Epidural   Post-op Pain Management:    Induction:   Airway Management Planned:   Additional Equipment:   Intra-op Plan:   Post-operative Plan:   Informed Consent: I have reviewed the patients History and Physical, chart, labs and discussed the procedure including the risks, benefits and alternatives for the proposed anesthesia with the patient or authorized representative who has indicated his/her understanding and acceptance.     Plan Discussed with:   Anesthesia Plan Comments:         Anesthesia Quick Evaluation

## 2016-08-17 ENCOUNTER — Encounter: Payer: Self-pay | Admitting: Emergency Medicine

## 2016-08-17 DIAGNOSIS — F1290 Cannabis use, unspecified, uncomplicated: Secondary | ICD-10-CM | POA: Diagnosis present

## 2016-08-17 LAB — CBC
HEMATOCRIT: 28.7 % — AB (ref 35.0–47.0)
HEMOGLOBIN: 10.2 g/dL — AB (ref 12.0–16.0)
MCH: 31.9 pg (ref 26.0–34.0)
MCHC: 35.6 g/dL (ref 32.0–36.0)
MCV: 89.7 fL (ref 80.0–100.0)
Platelets: 195 10*3/uL (ref 150–440)
RBC: 3.21 MIL/uL — AB (ref 3.80–5.20)
RDW: 13.4 % (ref 11.5–14.5)
WBC: 11.6 10*3/uL — AB (ref 3.6–11.0)

## 2016-08-17 LAB — RPR: RPR: NONREACTIVE

## 2016-08-17 MED ORDER — IBUPROFEN 800 MG PO TABS: 800.0000 mg | ORAL_TABLET | Freq: Three times a day (TID) | ORAL | 0 refills | Status: DC | PRN

## 2016-08-17 MED ORDER — DOCUSATE SODIUM 100 MG PO CAPS: 100.0000 mg | ORAL_CAPSULE | Freq: Two times a day (BID) | ORAL | 0 refills | Status: DC

## 2016-08-17 NOTE — Progress Notes (Signed)
Patient understands all discharge instructions and the need to make follow up appointments. Patient discharge via wheelchair with RN. 

## 2016-08-17 NOTE — Clinical Social Work Maternal (Signed)
  CLINICAL SOCIAL WORK MATERNAL/CHILD NOTE  Patient Details  Name: Sally Kelley MRN: [id number] Date of Birth: Apr 13, 1991  Date:  08/17/2016  Clinical Social Worker Initiating Note:  Shela Leff MSW,LCSW Date/ Time Initiated:  08/17/16/      Child's Name:      Legal Guardian:  Mother   Need for Interpreter:  None   Date of Referral:        Reason for Referral:  Current Substance Use/Substance Use During Pregnancy    Referral Source:  RN   Address:     Phone number:      Household Members:  Spouse   Natural Supports (not living in the home):  Immediate Family   Professional Supports: None   Employment:     Type of Work:     Education:      Pensions consultant:  [employer]   Other Resources:  [employer]   Cultural/Religious Considerations Which May Impact Care:  none Strengths:  Ability to meet basic needs , Compliance with medical plan , Home prepared for child    Risk Factors/Current Problems:  Substance Use    Cognitive State:  Alert , Able to Concentrate    Mood/Affect:  Bright , Happy    CSW Assessment: CSW consulted on patient's newborn due to drug exposure during pregnancy. Patient's mother's urine drug screen was positive for marijuana and baby's urine drug screen was negative for all illicit substances. Patient's cord was sent for testing and currently awaiting results. CSW met with patient and her husband in the room. Patient gave permission for husband to stay. This is their first child. Patient reports she has all necessities and supplies for their newborn. Patient had questions about [facility] and CSW provided information. Patient and husband deny any history of mental illness. Education surrounding postpartum depression was given. CSW addressed the use of marijuana and patient reports that she used to stimulate appetite and to calm her down. Patient states she began using marijuana at a very early age. As this is an ongoing usage by patient, CSW reiterated  never to use in front of her newborn or in the same area. CSW educated patient and husband regarding if cord sample comes back positive then a DSS CPS report will be made. Patient and husband verbalized understanding.  CSW Plan/Description:  Engineer, mining , Information/Referral to [employer] , No Further Intervention Required/No Barriers to Discharge    Shela Leff, LCSW 08/17/2016, 9:58 AM

## 2016-08-17 NOTE — Anesthesia Postprocedure Evaluation (Signed)
Anesthesia Post Note  Patient: Sally Kelley  Procedure(s) Performed: * No procedures listed *  Patient location during evaluation: Mother Baby Anesthesia Type: Epidural Level of consciousness: awake, awake and alert and oriented Pain management: pain level controlled Vital Signs Assessment: post-procedure vital signs reviewed and stable Respiratory status: spontaneous breathing Cardiovascular status: blood pressure returned to baseline Postop Assessment: no headache, no backache, no signs of nausea or vomiting and adequate PO intake Anesthetic complications: no     Last Vitals:  Vitals:   08/17/16 0300 08/17/16 0739  BP: 117/88 112/63  Pulse: 87 97  Resp: 20 20  Temp: 36.3 C 37.1 C    Last Pain:  Vitals:   08/17/16 0815  TempSrc:   PainSc: Asleep                 Karoline Caldwelleana Zahniya Zellars

## 2017-05-20 ENCOUNTER — Encounter: Payer: Self-pay | Admitting: Emergency Medicine

## 2017-05-20 ENCOUNTER — Emergency Department
Admission: EM | Admit: 2017-05-20 | Discharge: 2017-05-20 | Disposition: A | Discharge status: Home / Self Care | Payer: Medicaid Other | Attending: Emergency Medicine | Admitting: Emergency Medicine

## 2017-05-20 ENCOUNTER — Emergency Department: Payer: Medicaid Other

## 2017-05-20 DIAGNOSIS — F1290 Cannabis use, unspecified, uncomplicated: Secondary | ICD-10-CM | POA: Insufficient documentation

## 2017-05-20 DIAGNOSIS — F17210 Nicotine dependence, cigarettes, uncomplicated: Secondary | ICD-10-CM | POA: Insufficient documentation

## 2017-05-20 DIAGNOSIS — M4180 Other forms of scoliosis, site unspecified: Secondary | ICD-10-CM

## 2017-05-20 DIAGNOSIS — M4184 Other forms of scoliosis, thoracic region: Secondary | ICD-10-CM | POA: Insufficient documentation

## 2017-05-20 DIAGNOSIS — M549 Dorsalgia, unspecified: Secondary | ICD-10-CM | POA: Diagnosis present

## 2017-05-20 MED ORDER — OXYCODONE-ACETAMINOPHEN 5-325 MG PO TABS
ORAL_TABLET | ORAL | Status: AC
  Administered 2017-05-20: 1 via ORAL
  Filled 2017-05-20: qty 1

## 2017-05-20 MED ORDER — CYCLOBENZAPRINE HCL 10 MG PO TABS
5.0000 mg | ORAL_TABLET | Freq: Once | ORAL | Status: AC
  Administered 2017-05-20: 5 mg via ORAL
  Filled 2017-05-20: qty 1

## 2017-05-20 MED ORDER — OXYCODONE-ACETAMINOPHEN 5-325 MG PO TABS
1.0000 | ORAL_TABLET | ORAL | Status: AC | PRN
  Administered 2017-05-20 (×2): 1 via ORAL
  Filled 2017-05-20: qty 1

## 2017-05-20 MED ORDER — CYCLOBENZAPRINE HCL 5 MG PO TABS: 5.0000 mg | ORAL_TABLET | Freq: Three times a day (TID) | ORAL | 0 refills | Status: AC | PRN

## 2017-05-20 MED ORDER — KETOROLAC TROMETHAMINE 30 MG/ML IJ SOLN
30.0000 mg | Freq: Once | INTRAMUSCULAR | Status: AC
  Administered 2017-05-20: 30 mg via INTRAMUSCULAR
  Filled 2017-05-20: qty 1

## 2017-05-20 NOTE — ED Provider Notes (Signed)
[facility] Emergency Department Provider Note  ____________________________________________  Time seen: Approximately 9:56 AM  I have reviewed the triage vital signs and the nursing notes.   HISTORY  Chief Complaint Back Pain    HPI Sally Kelley is a 26 y.o. female that presents to the emergency room for evaluation of upper back pain and shoulder pain for one day. Back pain started when she was doing household chores yesterday and feeding her daughter. Pain is primarily in the center of her back and in both shoulders. Patient states that she gets back pain on and off frequently but today it was worse than usual. She had difficulty sleeping last night due to pain. Her husband gave her a back rub yesterday which helped. She gained 30 pounds and is wondering if her posture contributes. She is also now carrying around her baby, which adds weight. No trauma.No shortness of breath, chest pain, weakness, numbness, tingling.   Past Medical History:  Diagnosis Date  . Medical history non-contributory     Patient Active Problem List   Diagnosis Date Noted  . 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 Pre-eclampsia during pregnancy in third trimester, antepartum 08/16/2016  . 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 INDUCED ABORTION  2011  . WISDOM TOOTH EXTRACTION Left 2013    Prior to Admission medications   Medication Sig Start Date End Date Taking? Authorizing Provider  cyclobenzaprine (FLEXERIL) 5 MG tablet Take 1 tablet (5 mg total) by mouth 3 (three) times daily as needed for muscle spasms. 05/20/17 05/27/17  Enid Derry, PA-C  docusate sodium (COLACE) 100 MG capsule Take 1 capsule (100 mg total) by mouth 2 (two) times daily. To keep stools soft 08/17/16   Christeen Douglas, MD  ibuprofen (ADVIL,MOTRIN) 800 MG tablet Take 1 tablet (800 mg total) by mouth every 8 (eight) hours as needed  for moderate pain. 08/17/16   Christeen Douglas, MD  promethazine (PHENERGAN) 25 MG tablet Take 25 mg by mouth every 6 (six) hours as needed for nausea or vomiting.    [provider]    Allergies Patient has no known allergies.  No family history on file.  Social History Social History  Substance Use Topics  . Smoking status: Current Every Day Smoker    Packs/day: 0.25    Types: Cigarettes  . Smokeless tobacco: Never Used  . Alcohol use No     Review of Systems  Constitutional: No fever/chills Cardiovascular: No chest pain. Respiratory: No SOB. Gastrointestinal: No abdominal pain.  No nausea, no vomiting.  Musculoskeletal: Positive for back pain. Skin: Negative for rash, abrasions, lacerations, ecchymosis. Neurological: Negative for headaches, numbness or tingling   ____________________________________________   PHYSICAL EXAM:  VITAL SIGNS: ED Triage Vitals [05/20/17 0900]  Enc Vitals Group     BP (!) 141/101     Pulse Rate 73     Resp 17     Temp 98.3 F (36.8 C)     Temp Source Oral     SpO2 100 %     Weight 170 lb (77.1 kg)     Height  (1.626 m)     Head Circumference      Peak Flow      Pain Score 8     Pain Loc      Pain Edu?      Excl. in GC?      Constitutional: Alert and oriented.  Well appearing and in no acute distress. Eyes: Conjunctivae are normal. PERRL. EOMI. Head: Atraumatic. ENT:      Ears:      Nose: No congestion/rhinnorhea.      Mouth/Throat: Mucous membranes are moist.  Neck: No stridor.  No cervical spine tenderness to palpation. Cardiovascular: Normal rate, regular rhythm.  Good peripheral circulation. Respiratory: Normal respiratory effort without tachypnea or retractions. Lungs CTAB. Good air entry to the bases with no decreased or absent breath sounds. Gastrointestinal: Bowel sounds 4 quadrants. Soft and nontender to palpation. No guarding or rigidity. No palpable masses. No distention. No  Musculoskeletal: Full  range of motion to all extremities. No gross deformities appreciated. Tenderness to palpation over inferior thoracic spine. No tenderness to palpation over lumbar spine. Minimal tenderness to palpation over paraspinal muscles. Strength 5 over 5 in upper and lower extremities bilaterally. Neurologic:  Normal speech and language. No gross focal neurologic deficits are appreciated.  Skin:  Skin is warm, dry and intact. No rash noted. Psychiatric: Mood and affect are normal. Speech and behavior are normal. Patient exhibits appropriate insight and judgement.   ____________________________________________   LABS (all labs ordered are listed, but only abnormal results are displayed)  Labs Reviewed - No data to display ____________________________________________  EKG   ____________________________________________  RADIOLOGY Lexine Baton, personally viewed and evaluated these images (plain radiographs) as part of my medical decision making, as well as reviewing the written report by the radiologist.  Dg Thoracic Spine 2 View  Result Date: 05/20/2017 CLINICAL DATA:  Thoracic back pain beginning yesterday. No known injury. EXAM: THORACIC SPINE 2 VIEWS COMPARISON:  None. FINDINGS: There is no evidence of thoracic spine fracture. Alignment is normal. No other bone lesions identified. Minimal dextroscoliosis noted. IMPRESSION: No acute findings.  Minimal thoracic dextroscoliosis. Electronically Signed   By: Myles Rosenthal M.D.   On: 05/20/2017 10:31    ____________________________________________    PROCEDURES  Procedure(s) performed:    Procedures    Medications  oxyCODONE-acetaminophen (PERCOCET/ROXICET) 5-325 MG per tablet 1 tablet (1 tablet Oral Given 05/20/17 1004)  ketorolac (TORADOL) 30 MG/ML injection 30 mg (30 mg Intramuscular Given 05/20/17 1004)  cyclobenzaprine (FLEXERIL) tablet 5 mg (5 mg Oral Given 05/20/17 1005)      ____________________________________________   INITIAL IMPRESSION / ASSESSMENT AND PLAN / ED COURSE  Pertinent labs & imaging results that were available during my care of the patient were reviewed by me and considered in my medical decision making (see chart for details).  Review of the [place] CSRS was performed in accordance of the [facility] prior to dispensing any controlled drugs.     Patient's diagnosis is consistent with dextroscoliosis. Vital signs and exam are reassuring. X-ray consistent with scoliosis. Patient was given Percocet in triage. She was also given IM Toradol and Flexeril in room. Pain improved significantly after medication. Patient will be discharged home with prescriptions for Flexeril. Patient is to follow up with a chiropractor as directed. Patient is given ED precautions to return to the ED for any worsening or new symptoms.     ____________________________________________  FINAL CLINICAL IMPRESSION(S) / ED DIAGNOSES  Final diagnoses:  Dextroscoliosis      NEW MEDICATIONS STARTED DURING THIS VISIT:  Discharge Medication List as of 05/20/2017 11:19 AM    START taking these medications   Details  cyclobenzaprine (FLEXERIL) 5 MG tablet Take 1 tablet (5 mg total) by mouth 3 (three) times daily as needed for muscle spasms., Starting Thu 05/20/2017, Until Thu 05/27/2017,  Print            This chart was dictated using voice recognition software/Dragon. Despite best efforts to proofread, errors can occur which can change the meaning. Any change was purely unintentional. .awhp   Enid Derry, PA-C 05/20/17 1537    Governor Rooks, MD 05/21/17 0830

## 2017-05-20 NOTE — ED Triage Notes (Addendum)
Patient presents to ED via POV from home with c/o neck and upper back pain since yesterday. Patient denies any injury or trauma. Patient denies CP or back pain radiating any where. Patient states the pain came on gradually and has gotten progressively worse. Patient crying in triage.

## 2017-05-20 NOTE — ED Notes (Signed)
Patient transported to X-ray 

## 2017-05-20 NOTE — ED Notes (Signed)
See triage note. states she developed back pain yesterday while she was feeding her daughter  States pain radiates from neck into mid pain  Denies any trauma    Was given pain meds in triage and states pain is easing off at present

## 2017-05-26 ENCOUNTER — Ambulatory Visit: Admission: RE | Admit: 2017-05-26 | Discharge: 2017-05-26

## 2017-05-26 DIAGNOSIS — R32 Unspecified urinary incontinence: Secondary | ICD-10-CM

## 2017-05-26 DIAGNOSIS — M549 Dorsalgia, unspecified: Principal | ICD-10-CM

## 2017-05-26 DIAGNOSIS — M419 Scoliosis, unspecified: Secondary | ICD-10-CM

## 2017-06-01 ENCOUNTER — Emergency Department
Admission: EM | Admit: 2017-06-01 | Discharge: 2017-06-01 | Disposition: A | Discharge status: Home / Self Care | Payer: MEDICAID | Source: Intra-hospital | Attending: Emergency Medicine | Admitting: Emergency Medicine

## 2017-06-01 ENCOUNTER — Emergency Department
Admission: EM | Admit: 2017-06-01 | Discharge: 2017-06-01 | Disposition: A | Discharge status: Home / Self Care | Payer: MEDICAID | Source: Intra-hospital

## 2017-06-01 DIAGNOSIS — M549 Dorsalgia, unspecified: Principal | ICD-10-CM

## 2017-06-09 ENCOUNTER — Ambulatory Visit
Admission: RE | Admit: 2017-06-09 | Discharge: 2017-06-09 | Disposition: A | Discharge status: Home / Self Care | Payer: MEDICAID

## 2017-06-09 DIAGNOSIS — M5416 Radiculopathy, lumbar region: Secondary | ICD-10-CM

## 2017-06-09 DIAGNOSIS — R32 Unspecified urinary incontinence: Secondary | ICD-10-CM

## 2017-06-09 DIAGNOSIS — R29898 Other symptoms and signs involving the musculoskeletal system: Principal | ICD-10-CM

## 2017-06-09 DIAGNOSIS — Z13828 Encounter for screening for other musculoskeletal disorder: Principal | ICD-10-CM

## 2017-06-09 MED ORDER — CYCLOBENZAPRINE 5 MG TABLET
ORAL_TABLET | Freq: Three times a day (TID) | ORAL | 1 refills | 0 days | Status: CP | PRN
Start: 2017-06-09 — End: 2019-04-12

## 2017-06-09 MED ORDER — MELOXICAM 15 MG TABLET
ORAL_TABLET | Freq: Every day | ORAL | 2 refills | 0 days | Status: CP
Start: 2017-06-09 — End: ?

## 2017-06-30 ENCOUNTER — Emergency Department: Payer: Medicaid Other

## 2017-06-30 ENCOUNTER — Encounter: Payer: Self-pay | Admitting: Emergency Medicine

## 2017-06-30 ENCOUNTER — Emergency Department
Admission: EM | Admit: 2017-06-30 | Discharge: 2017-06-30 | Disposition: A | Discharge status: Home / Self Care | Payer: Medicaid Other | Attending: Emergency Medicine | Admitting: Emergency Medicine

## 2017-06-30 ENCOUNTER — Other Ambulatory Visit: Payer: Self-pay

## 2017-06-30 DIAGNOSIS — F17210 Nicotine dependence, cigarettes, uncomplicated: Secondary | ICD-10-CM | POA: Insufficient documentation

## 2017-06-30 DIAGNOSIS — R079 Chest pain, unspecified: Secondary | ICD-10-CM | POA: Diagnosis present

## 2017-06-30 DIAGNOSIS — R1013 Epigastric pain: Secondary | ICD-10-CM | POA: Insufficient documentation

## 2017-06-30 DIAGNOSIS — M549 Dorsalgia, unspecified: Secondary | ICD-10-CM | POA: Diagnosis not present

## 2017-06-30 LAB — BASIC METABOLIC PANEL
Anion gap: 10 (ref 5–15)
BUN: 11 mg/dL (ref 6–20)
CHLORIDE: 105 mmol/L (ref 101–111)
CO2: 23 mmol/L (ref 22–32)
CREATININE: 0.87 mg/dL (ref 0.44–1.00)
Calcium: 9 mg/dL (ref 8.9–10.3)
GFR calc Af Amer: >60 mL/min (ref >60)
GFR calc non Af Amer: >60 mL/min (ref >60)
GLUCOSE: 139 mg/dL — AB (ref 65–99)
POTASSIUM: 3.4 mmol/L — AB (ref 3.5–5.1)
SODIUM: 138 mmol/L (ref 135–145)

## 2017-06-30 LAB — CBC
HEMATOCRIT: 46.6 % (ref 35.0–47.0)
Hemoglobin: 15.5 g/dL (ref 12.0–16.0)
MCH: 30.9 pg (ref 26.0–34.0)
MCHC: 33.3 g/dL (ref 32.0–36.0)
MCV: 93 fL (ref 80.0–100.0)
PLATELETS: 300 10*3/uL (ref 150–440)
RBC: 5.01 MIL/uL (ref 3.80–5.20)
RDW: 13.1 % (ref 11.5–14.5)
WBC: 12.8 10*3/uL — AB (ref 3.6–11.0)

## 2017-06-30 LAB — HEPATIC FUNCTION PANEL
ALK PHOS: 73 U/L (ref 38–126)
ALT: 40 U/L (ref 14–54)
AST: 30 U/L (ref 15–41)
Albumin: 4.1 g/dL (ref 3.5–5.0)
BILIRUBIN TOTAL: 0.5 mg/dL (ref 0.3–1.2)
Bilirubin, Direct: <0.1 mg/dL — ABNORMAL LOW (ref 0.1–0.5)
TOTAL PROTEIN: 7.5 g/dL (ref 6.5–8.1)

## 2017-06-30 LAB — HCG, QUANTITATIVE, PREGNANCY: hCG, Beta Chain, Quant, S: 1 m[IU]/mL (ref <5)

## 2017-06-30 LAB — LIPASE, BLOOD: Lipase: 28 U/L (ref 11–51)

## 2017-06-30 LAB — TROPONIN I: Troponin I: <0.03 ng/mL (ref <0.03)

## 2017-06-30 LAB — POCT PREGNANCY, URINE: Preg Test, Ur: NEGATIVE

## 2017-06-30 MED ORDER — ONDANSETRON HCL 4 MG PO TABS: 4.0000 mg | ORAL_TABLET | Freq: Every day | ORAL | 0 refills | Status: AC | PRN

## 2017-06-30 MED ORDER — ONDANSETRON HCL 4 MG/2ML IJ SOLN
INTRAMUSCULAR | Status: AC
  Administered 2017-06-30: 4 mg via INTRAVENOUS
  Filled 2017-06-30: qty 2

## 2017-06-30 MED ORDER — ONDANSETRON HCL 4 MG/2ML IJ SOLN
4.0000 mg | Freq: Once | INTRAMUSCULAR | Status: AC
  Administered 2017-06-30: 4 mg via INTRAVENOUS

## 2017-06-30 MED ORDER — IOPAMIDOL (ISOVUE-370) INJECTION 76%
100.0000 mL | Freq: Once | INTRAVENOUS | Status: AC | PRN
  Administered 2017-06-30: 100 mL via INTRAVENOUS

## 2017-06-30 MED ORDER — KETOROLAC TROMETHAMINE 30 MG/ML IJ SOLN
15.0000 mg | Freq: Once | INTRAMUSCULAR | Status: AC
  Administered 2017-06-30: 15 mg via INTRAVENOUS
  Filled 2017-06-30: qty 1

## 2017-06-30 MED ORDER — ONDANSETRON HCL 4 MG/2ML IJ SOLN
4.0000 mg | Freq: Once | INTRAMUSCULAR | Status: AC
  Administered 2017-06-30: 4 mg via INTRAVENOUS
  Filled 2017-06-30: qty 2

## 2017-06-30 MED ORDER — SODIUM CHLORIDE 0.9 % IV BOLUS (SEPSIS)
1000.0000 mL | Freq: Once | INTRAVENOUS | Status: AC
  Administered 2017-06-30: 1000 mL via INTRAVENOUS

## 2017-06-30 MED ORDER — MORPHINE SULFATE (PF) 4 MG/ML IV SOLN
4.0000 mg | Freq: Once | INTRAVENOUS | Status: AC
  Administered 2017-06-30: 4 mg via INTRAVENOUS
  Filled 2017-06-30: qty 1

## 2017-06-30 MED ORDER — HYDROCODONE-ACETAMINOPHEN 5-325 MG PO TABS: 1.0000 | ORAL_TABLET | Freq: Four times a day (QID) | ORAL | 0 refills | Status: DC | PRN

## 2017-06-30 NOTE — ED Triage Notes (Signed)
Pt arrived to the ED for complaints of back pain that radiates to the chest and makes the Pt feel sick and vomit. Pt reports that this has been happening for the last month and they have not been able to figure out what is happening Pt is AOx4, anxious and actively vomiting in triage.

## 2017-06-30 NOTE — ED Notes (Signed)
Pt called out and stated she "had peed in the bed." This RN went into to rm with change of pants and underwear, pt changed without difficulty. Linens changed, pt back in bed and given warm blanket and pillow. Pt in NAD at this time.

## 2017-06-30 NOTE — ED Notes (Signed)
Patient transported to X-ray 

## 2017-06-30 NOTE — ED Provider Notes (Signed)
[facility] Emergency Department Provider Note  ____________________________________________   First MD Initiated Contact with Patient 06/30/17 [phone]     (approximate)  I have reviewed the triage vital signs and the nursing notes.   HISTORY  Chief Complaint Chest Pain   HPI Sally Kelley is a 26 y.o. female who self presents to the emergency department with multiple complaints.  She came to the emergency department today for severe right upper back pain that woke her from sleep at 2 AM roughly 4 hours prior to arrival as well as severe epigastric upper abdominal discomfort, nausea, vomiting since 2 AM as well.  The patient states these symptoms are not new and have been intermittent for the past 10 months or so ever since giving birth to her last child.  She has been worked up extensively including MRI of her cervical and [facility] without clear etiology of her symptoms.  The patient is extremely frustrated that she has not been given a definite diagnosis.  Her symptoms seem to only occur in the morning and they happen 1-4 times a week.  She is concerned that she may have issues with her gallbladder.  She has no history of abdominal surgeries.  Past Medical History:  Diagnosis Date  . Medical history non-contributory     Patient Active Problem List   Diagnosis Date Noted  . 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 Pre-eclampsia during pregnancy in third trimester, antepartum 08/16/2016  . 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 INDUCED ABORTION  2011  . WISDOM TOOTH EXTRACTION Left 2013    Prior to Admission medications   Medication Sig Start Date End Date Taking? Authorizing Provider  docusate sodium (COLACE) 100 MG capsule Take 1 capsule (100 mg total) by mouth 2 (two) times daily. To keep stools soft Patient not taking: Reported on 06/30/2017 08/17/16    Christeen DouglasBeasley, Bethany, MD  HYDROcodone-acetaminophen (NORCO) 5-325 MG tablet Take 1 tablet every 6 (six) hours as needed for up to 9 doses by mouth for severe pain. 06/30/17   Merrily Brittleifenbark, Gearline Spilman, MD  ibuprofen (ADVIL,MOTRIN) 800 MG tablet Take 1 tablet (800 mg total) by mouth every 8 (eight) hours as needed for moderate pain. Patient not taking: Reported on 06/30/2017 08/17/16   Christeen DouglasBeasley, Bethany, MD  ondansetron (ZOFRAN) 4 MG tablet Take 1 tablet (4 mg total) daily as needed by mouth for nausea or vomiting. 06/30/17 06/30/18  Merrily Brittleifenbark, Denvil Canning, MD  promethazine (PHENERGAN) 25 MG tablet Take 25 mg by mouth every 6 (six) hours as needed for nausea or vomiting.    [provider]    Allergies Patient has no known allergies.  History reviewed. No pertinent family history.  Social History Social History   Tobacco Use  . Smoking status: Current Every Day Smoker    Packs/day: 0.25    Types: Cigarettes  . Smokeless tobacco: Never Used  Substance Use Topics  . Alcohol use: No  . Drug use: Yes    Types: Marijuana    Review of Systems Constitutional: No fever/chills Eyes: No visual changes. ENT: No sore throat. Cardiovascular: Denies chest pain. Respiratory: Denies shortness of breath. Gastrointestinal: No abdominal pain.  No nausea, no vomiting.  No diarrhea.  No constipation. Genitourinary: Negative for dysuria. Musculoskeletal: Positive for back pain. Skin: Negative for rash. Neurological: Negative for headaches, focal weakness or numbness.   ____________________________________________   PHYSICAL EXAM:  VITAL  SIGNS: ED Triage Vitals  Enc Vitals Group     BP 06/30/17 0537 (!) 156/110     Pulse Rate 06/30/17 0537 84     Resp 06/30/17 0537 (!) 22     Temp 06/30/17 0537 98.2 F (36.8 C)     Temp Source 06/30/17 0537 Oral     SpO2 06/30/17 0537 99 %     Weight 06/30/17 0538 165 lb (74.8 kg)     Height 06/30/17 0538 5\' 4"  (1.626 m)     Head Circumference --      Peak Flow --       Pain Score 06/30/17 0537 9     Pain Loc --      Pain Edu? --      Excl. in GC? --     Constitutional: Alert and oriented x4 tearful and uncomfortable appearing no diaphoresis Eyes: PERRL EOMI. Head: Atraumatic. Nose: No congestion/rhinnorhea. Mouth/Throat: No trismus Neck: No stridor.   Cardiovascular: Normal rate, regular rhythm. Grossly normal heart sounds.  Good peripheral circulation. Respiratory: Normal respiratory effort.  No retractions. Lungs CTAB and moving good air Gastrointestinal: Soft nontender Musculoskeletal: Focally exquisitely tender thoracic spine on the right side with spasm Neurologic:  Normal speech and language. No gross focal neurologic deficits are appreciated. Skin:  Skin is warm, dry and intact. No rash noted. Psychiatric: Mood and affect are normal. Speech and behavior are normal.    ____________________________________________   DIFFERENTIAL includes but not limited to  Pulmonary embolism, aortic dissection, cholelithiasis, fracture ____________________________________________   LABS (all labs ordered are listed, but only abnormal results are displayed)  Labs Reviewed  BASIC METABOLIC PANEL - Abnormal; Notable for the following components:      Result Value   Potassium 3.4 (*)    Glucose, Bld 139 (*)    All other components within normal limits  CBC - Abnormal; Notable for the following components:   WBC 12.8 (*)    All other components within normal limits  HEPATIC FUNCTION PANEL - Abnormal; Notable for the following components:   Bilirubin, Direct <0.1 (*)    All other components within normal limits  TROPONIN I  HCG, QUANTITATIVE, PREGNANCY  LIPASE, BLOOD  POC URINE PREG, ED  POCT PREGNANCY, URINE    Blood work reviewed and interpreted by me with no acute disease __________________________________________  EKG  ED ECG REPORT I, Merrily Brittle, the attending physician, personally viewed and interpreted this ECG.  Date:  06/30/2017 EKG Time:  Rate: 77 Rhythm: normal sinus rhythm QRS Axis: normal Intervals: normal ST/T Wave abnormalities: normal Narrative Interpretation: no evidence of acute ischemia  ____________________________________________  RADIOLOGY  CT pulmonary angiogram and CT abdomen pelvis reviewed by me with no acute disease ____________________________________________   PROCEDURES  Procedure(s) performed: no  Procedures  Critical Care performed: no  Observation: no ____________________________________________   INITIAL IMPRESSION / ASSESSMENT AND PLAN / ED COURSE  Pertinent labs & imaging results that were available during my care of the patient were reviewed by me and considered in my medical decision making (see chart for details).  The patient arrives with exquisite discomfort and an extensive previous workup that is not given an etiology of her back pain.  She has never had CT scans of her abdomen pelvis her chest for pulmonary embolism and as this is her third or fourth visit I do think is reasonable to expand our workup and get CTs today.  Morphine for pain now.     ----------------------------------------- 9:31 AM  on 06/30/2017 -----------------------------------------  Fortunately the patient CT scan is negative for acute pathology.  Her pain is down to a 1 out of 10 although she is extremely frustrated that her symptoms persist.  She does have an appointment already scheduled for physical therapy and I have encouraged her to keep it.  At this point the patient is medically stable for outpatient management verbalizes understanding and agreement the plan. ____________________________________________   FINAL CLINICAL IMPRESSION(S) / ED DIAGNOSES  Final diagnoses:  Upper back pain      NEW MEDICATIONS STARTED DURING THIS VISIT:  This SmartLink is deprecated. Use AVSMEDLIST instead to display the medication list for a patient.   Note:  This document was  prepared using Dragon voice recognition software and may include unintentional dictation errors.     Merrily Brittleifenbark, Kiven Vangilder, MD 07/01/17 [phone]

## 2017-06-30 NOTE — ED Notes (Signed)
Patient asked for remote and coke. Coke given and TV turned on

## 2017-06-30 NOTE — ED Notes (Signed)
Pt notified urine sample is needed.

## 2017-06-30 NOTE — ED Notes (Signed)
Pt reports she was seen here for back pain last month and dx with scoliosis and told to [facility] which [facility]. Pt [facility] "did not have scoliosis and her back is straight." Pt states she is frustrated and is tearful due to "no one knows what's wrong with me." Pt states she followed up with PCP and reports she was told "they couldn't find anything." Pt states she is "frustrated and does not want to leave until they find out why I've been having this pain for the last month." Pt given emotional support and notified EDP will be following up and that she is going to go to xray at this time, pt verbalizes understanding of this.

## 2017-06-30 NOTE — ED Notes (Addendum)
Pt had 1 episode of emesis in sink, yellow in color. Pt states freq episodes of emesis PTA. Pt given towel with warm water on it. Pt notified to use emesis bag which she has from triage laying beside her, from now on when she needs to throw up. Pt verbalizes understanding of this and states she will.

## 2017-06-30 NOTE — Discharge Instructions (Signed)
Please keep your appointment to follow-up with physical therapy as scheduled and also follow-up with your primary care physician within 1 week for reexamination.  Return to the emergency department sooner for any concerns whatsoever.  It was a pleasure to take care of you today, and thank you for coming to our emergency department.  If you have any questions or concerns before leaving please ask the nurse to grab me and I'm more than happy to go through your aftercare instructions again.  If you were prescribed any opioid pain medication today such as Norco, Vicodin, Percocet, morphine, hydrocodone, or oxycodone please make sure you do not drive when you are taking this medication as it can alter your ability to drive safely.  If you have any concerns once you are home that you are not improving or are in fact getting worse before you can make it to your follow-up appointment, please do not hesitate to call [phone] and come back for further evaluation.  Merrily Brittle, MD  Results for orders placed or performed during the hospital encounter of 06/30/17  Basic metabolic panel  Result Value Ref Range   Sodium 138 135 - 145 mmol/L   Potassium 3.4 (L) 3.5 - 5.1 mmol/L   Chloride 105 101 - 111 mmol/L   CO2 23 22 - 32 mmol/L   Glucose, Bld 139 (H) 65 - 99 mg/dL   BUN 11 6 - 20 mg/dL   Creatinine, Ser 1.61 0.44 - 1.00 mg/dL   Calcium 9.0 8.9 - 09.6 mg/dL   GFR calc non Af Amer >60 >60 mL/min   GFR calc Af Amer >60 >60 mL/min   Anion gap 10 5 - 15  CBC  Result Value Ref Range   WBC 12.8 (H) 3.6 - 11.0 K/uL   RBC 5.01 3.80 - 5.20 MIL/uL   Hemoglobin 15.5 12.0 - 16.0 g/dL   HCT 04.5 40.9 - 81.1 %   MCV 93.0 80.0 - 100.0 fL   MCH 30.9 26.0 - 34.0 pg   MCHC 33.3 32.0 - 36.0 g/dL   RDW 91.4 78.2 - 95.6 %   Platelets 300 150 - 440 K/uL  Troponin I  Result Value Ref Range   Troponin I <0.03 <0.03 ng/mL  hCG, quantitative, pregnancy  Result Value Ref Range   hCG, Beta Chain, Quant, S 1 <5 mIU/mL    Hepatic function panel  Result Value Ref Range   Total Protein 7.5 6.5 - 8.1 g/dL   Albumin 4.1 3.5 - 5.0 g/dL   AST 30 15 - 41 U/L   ALT 40 14 - 54 U/L   Alkaline Phosphatase 73 38 - 126 U/L   Total Bilirubin 0.5 0.3 - 1.2 mg/dL   Bilirubin, Direct <2.1 (L) 0.1 - 0.5 mg/dL   Indirect Bilirubin NOT CALCULATED 0.3 - 0.9 mg/dL  Lipase, blood  Result Value Ref Range   Lipase 28 11 - 51 U/L  Pregnancy, urine POC  Result Value Ref Range   Preg Test, Ur NEGATIVE NEGATIVE   Dg Chest 2 View  Result Date: 06/30/2017 CLINICAL DATA:  Acute onset of upper back pain radiating to the chest. Vomiting. EXAM: CHEST  2 VIEW COMPARISON:  Thoracic spine radiographs performed 05/20/2017 FINDINGS: The lungs are well-aerated and clear. There is no evidence of focal opacification, pleural effusion or pneumothorax. The heart is normal in size; the mediastinal contour is within normal limits. No acute osseous abnormalities are seen. IMPRESSION: No acute cardiopulmonary process seen. Electronically Signed  By: Roanna RaiderJeffery  Chang M.D.   On: 06/30/2017 06:28   Ct Angio Chest Pe W/cm &/or Wo Cm  Result Date: 06/30/2017 CLINICAL DATA:  Back pain radiating to the chest resulting in nausea and vomiting. Symptoms for the last month. EXAM: CT ANGIOGRAPHY CHEST CT ABDOMEN AND PELVIS WITH CONTRAST TECHNIQUE: Multidetector CT imaging of the chest was performed using the standard protocol during bolus administration of intravenous contrast. Multiplanar CT image reconstructions and MIPs were obtained to evaluate the vascular anatomy. Multidetector CT imaging of the abdomen and pelvis was performed using the standard protocol during bolus administration of intravenous contrast. CONTRAST:  100mL ISOVUE-370 IOPAMIDOL (ISOVUE-370) INJECTION 76% COMPARISON:  None. FINDINGS: CTA CHEST FINDINGS Cardiovascular: Satisfactory opacification of the pulmonary arteries to the segmental level. No evidence of pulmonary embolism. Normal heart size.  No pericardial effusion. Mediastinum/Nodes: Negative for adenopathy or inflammation. Small incidental gas-filled tracheal diverticulum right and posterior at the thoracic inlet. Lungs/Pleura: There is no edema, consolidation, effusion, or pneumothorax. Musculoskeletal: No acute or aggressive finding Review of the MIP images confirms the above findings. CT ABDOMEN and PELVIS FINDINGS Hepatobiliary: Simple appearing cysts at the hepatic hilum measuring 15 mm. This cyst is near the ducts, but is likely exophytic hepatic. No evidence of biliary obstruction or stone. Pancreas: Unremarkable. Spleen: Unremarkable. Adrenals/Urinary Tract: Negative adrenals. No hydronephrosis or stone. Subcentimeter cyst upper pole left kidney. Unremarkable bladder. Stomach/Bowel: No obstruction or bowel wall thickening. No appendicitis. Vascular/Lymphatic: No acute vascular abnormality. No mass or adenopathy. Reproductive:No pathologic findings. Other: No ascites or pneumoperitoneum. Musculoskeletal: No acute abnormalities. Review of the MIP images confirms the above findings. IMPRESSION: Chest CTA: Negative.  No pulmonary embolism. Abdominal CT: Negative.  No acute finding or explanation for vomiting. Electronically Signed   By: Marnee SpringJonathon  Watts M.D.   On: 06/30/2017 09:11   Ct Abdomen Pelvis W Contrast  Result Date: 06/30/2017 CLINICAL DATA:  Back pain radiating to the chest resulting in nausea and vomiting. Symptoms for the last month. EXAM: CT ANGIOGRAPHY CHEST CT ABDOMEN AND PELVIS WITH CONTRAST TECHNIQUE: Multidetector CT imaging of the chest was performed using the standard protocol during bolus administration of intravenous contrast. Multiplanar CT image reconstructions and MIPs were obtained to evaluate the vascular anatomy. Multidetector CT imaging of the abdomen and pelvis was performed using the standard protocol during bolus administration of intravenous contrast. CONTRAST:  100mL ISOVUE-370 IOPAMIDOL (ISOVUE-370) INJECTION  76% COMPARISON:  None. FINDINGS: CTA CHEST FINDINGS Cardiovascular: Satisfactory opacification of the pulmonary arteries to the segmental level. No evidence of pulmonary embolism. Normal heart size. No pericardial effusion. Mediastinum/Nodes: Negative for adenopathy or inflammation. Small incidental gas-filled tracheal diverticulum right and posterior at the thoracic inlet. Lungs/Pleura: There is no edema, consolidation, effusion, or pneumothorax. Musculoskeletal: No acute or aggressive finding Review of the MIP images confirms the above findings. CT ABDOMEN and PELVIS FINDINGS Hepatobiliary: Simple appearing cysts at the hepatic hilum measuring 15 mm. This cyst is near the ducts, but is likely exophytic hepatic. No evidence of biliary obstruction or stone. Pancreas: Unremarkable. Spleen: Unremarkable. Adrenals/Urinary Tract: Negative adrenals. No hydronephrosis or stone. Subcentimeter cyst upper pole left kidney. Unremarkable bladder. Stomach/Bowel: No obstruction or bowel wall thickening. No appendicitis. Vascular/Lymphatic: No acute vascular abnormality. No mass or adenopathy. Reproductive:No pathologic findings. Other: No ascites or pneumoperitoneum. Musculoskeletal: No acute abnormalities. Review of the MIP images confirms the above findings. IMPRESSION: Chest CTA: Negative.  No pulmonary embolism. Abdominal CT: Negative.  No acute finding or explanation for vomiting. Electronically Signed   By:  Marnee SpringJonathon  Watts M.D.   On: 06/30/2017 09:11

## 2017-10-25 ENCOUNTER — Ambulatory Visit: Admit: 2017-10-25 | Discharge: 2017-10-26 | Payer: MEDICAID

## 2017-10-25 DIAGNOSIS — L732 Hidradenitis suppurativa: Principal | ICD-10-CM

## 2017-10-25 DIAGNOSIS — M549 Dorsalgia, unspecified: Secondary | ICD-10-CM

## 2017-10-25 MED ORDER — SULFAMETHOXAZOLE 800 MG-TRIMETHOPRIM 160 MG TABLET
ORAL_TABLET | Freq: Two times a day (BID) | ORAL | 0 refills | 0 days | Status: CP
Start: 2017-10-25 — End: 2017-10-25

## 2017-10-25 MED ORDER — DOXYCYCLINE HYCLATE 100 MG CAPSULE
ORAL_CAPSULE | Freq: Two times a day (BID) | ORAL | 0 refills | 0 days | Status: CP
Start: 2017-10-25 — End: 2017-11-01

## 2017-10-25 MED ORDER — FLUCONAZOLE 150 MG TABLET
ORAL_TABLET | 0 refills | 0 days | Status: CP
Start: 2017-10-25 — End: 2019-04-12

## 2018-03-01 IMAGING — CR DG THORACIC SPINE 2V
1 series · 3 of 3 positions shown · non-contrast
Comparison: None.

CLINICAL DATA: Thoracic back pain beginning yesterday. No known
injury.

EXAM:
THORACIC SPINE 2 VIEWS

[Series 1: dg thoracic spine 2 view · 0.14mm/px · 3 of 3 slices shown]
[im 1/3]
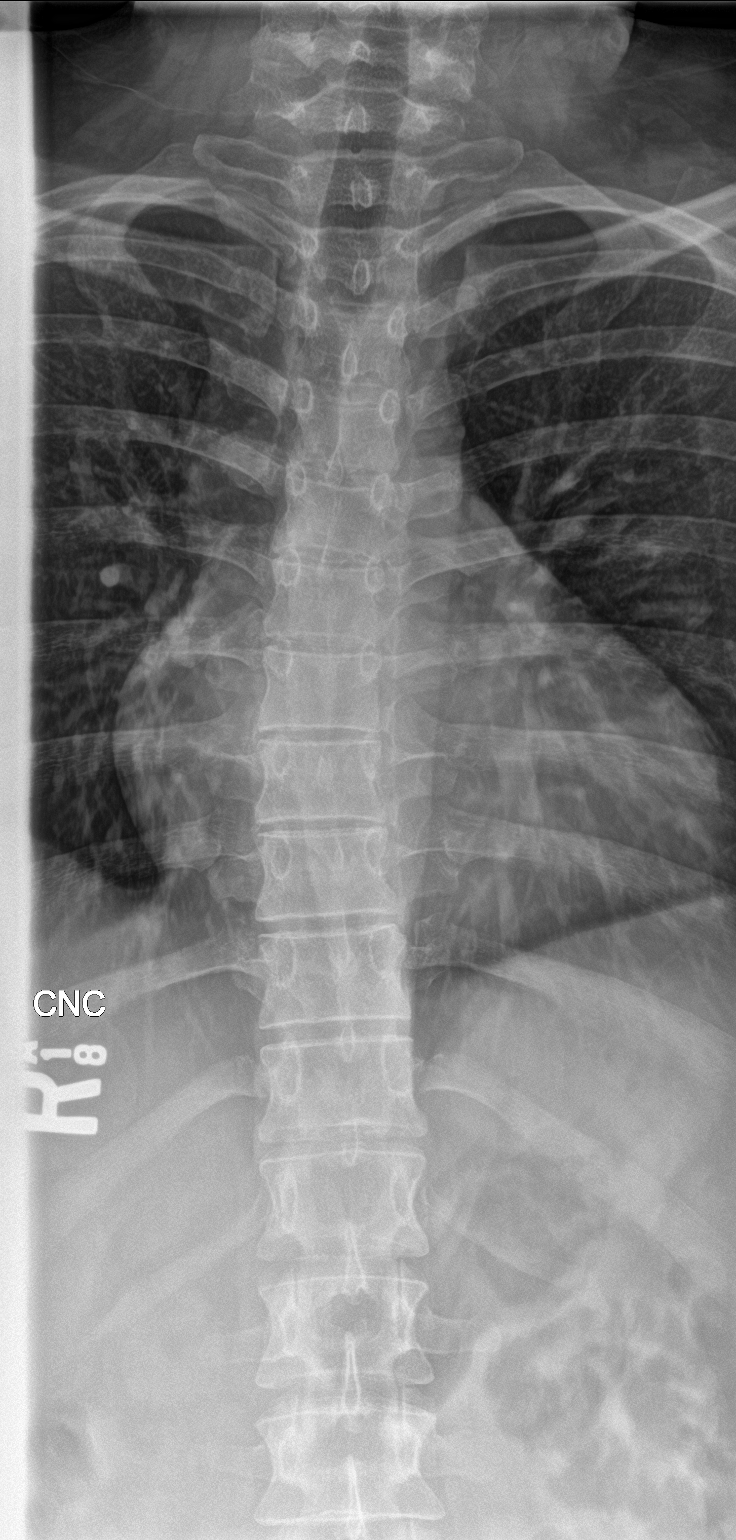
[im 2/3]
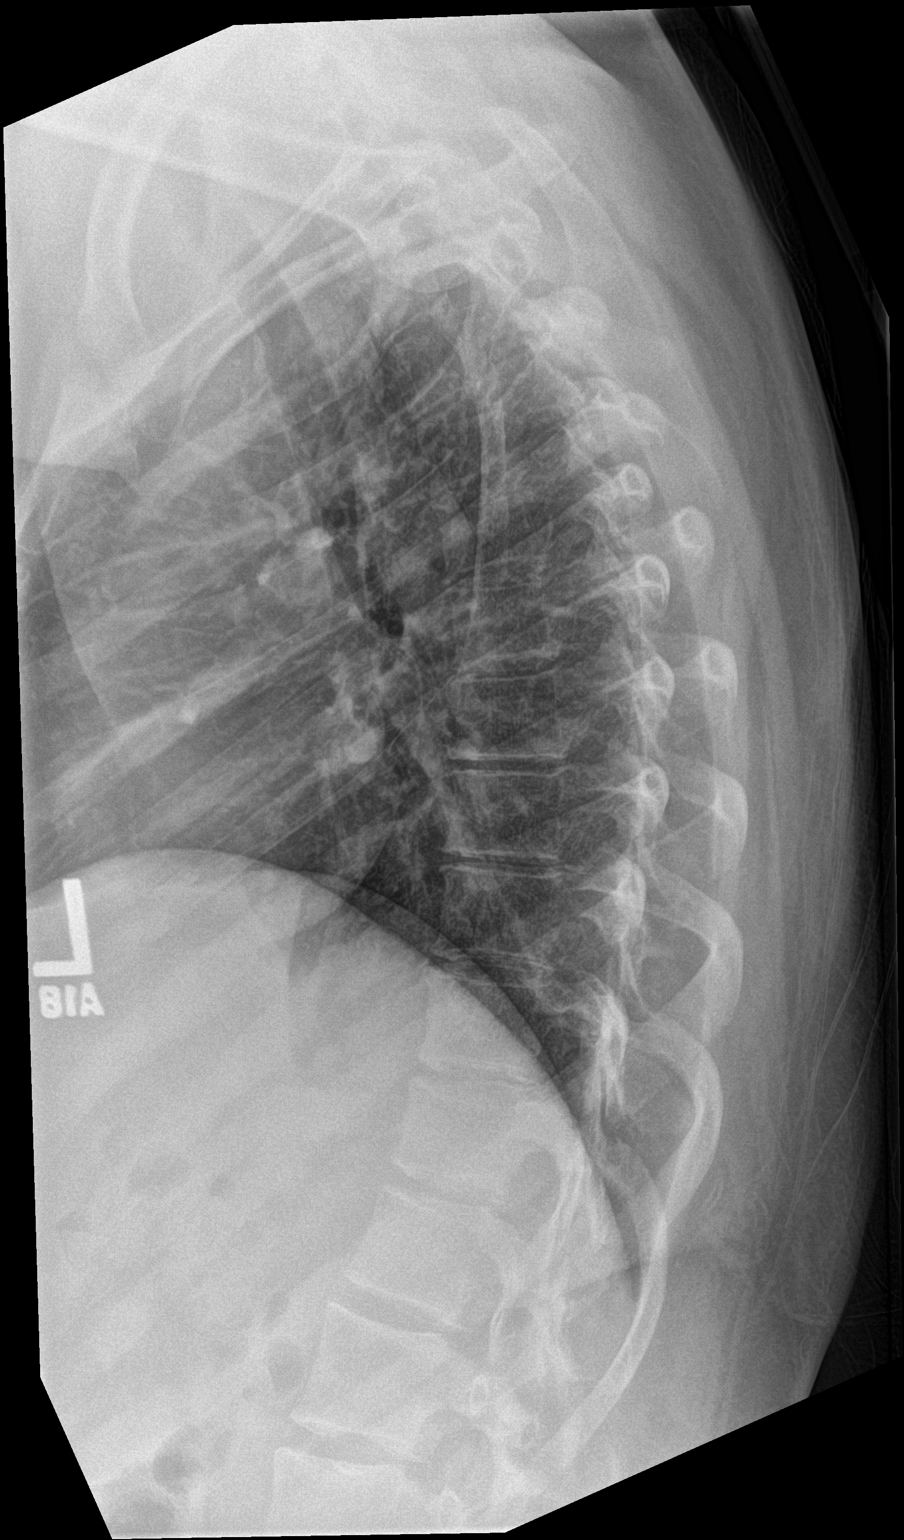
[im 3/3]
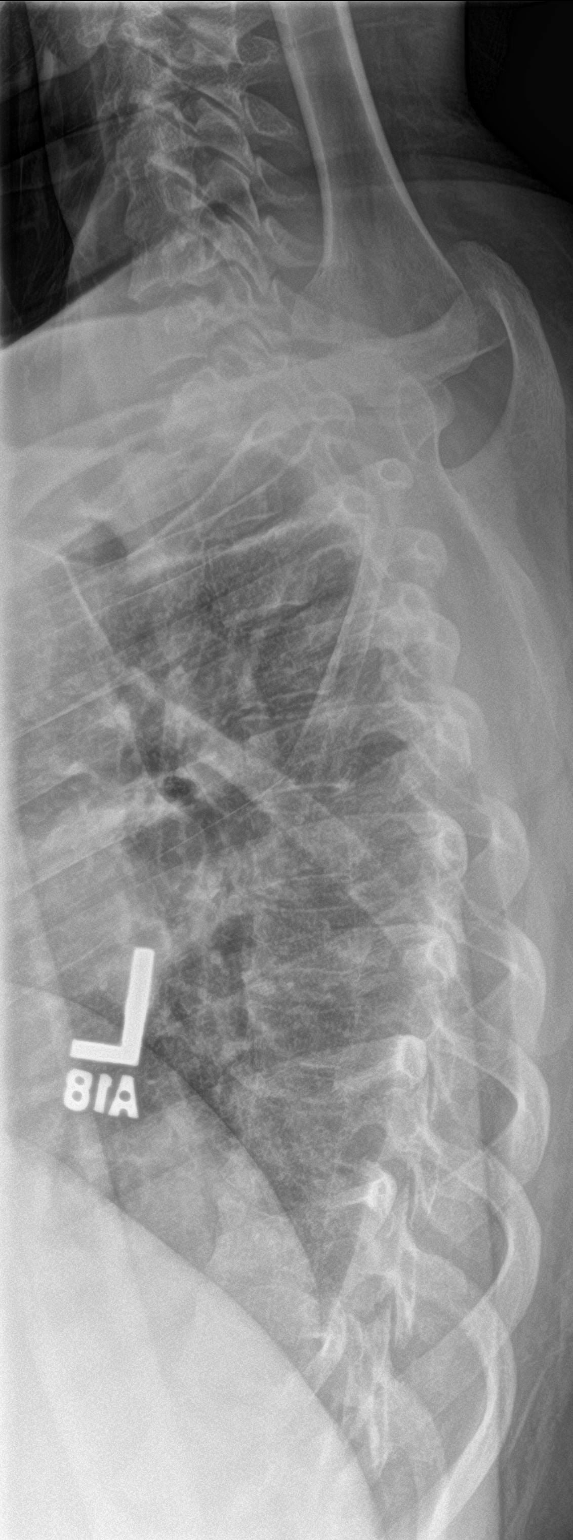

[3 of 3 positions shown; findings below may reference images not displayed]

FINDINGS: There is no evidence of thoracic spine fracture. Alignment is
normal. No other bone lesions identified. Minimal dextroscoliosis
noted.
IMPRESSION: No acute findings.  Minimal thoracic dextroscoliosis.

## 2018-08-22 ENCOUNTER — Ambulatory Visit: Admit: 2018-08-22 | Discharge: 2018-08-23 | Payer: MEDICAID

## 2018-08-22 DIAGNOSIS — L732 Hidradenitis suppurativa: Principal | ICD-10-CM

## 2018-08-22 MED ORDER — DOXYCYCLINE MONOHYDRATE 100 MG CAPSULE
ORAL_CAPSULE | Freq: Every day | ORAL | 0 refills | 0.00000 days | Status: CP
Start: 2018-08-22 — End: 2018-10-21

## 2018-08-22 MED ORDER — CHLORHEXIDINE GLUCONATE 4 % TOPICAL LIQUID
5 refills | 0 days | Status: CP
Start: 2018-08-22 — End: ?

## 2019-04-12 ENCOUNTER — Ambulatory Visit: Admit: 2019-04-12 | Discharge: 2019-04-13 | Payer: MEDICAID | Attending: Family | Primary: Family

## 2019-04-12 DIAGNOSIS — R300 Dysuria: Principal | ICD-10-CM

## 2019-04-12 DIAGNOSIS — Z209 Contact with and (suspected) exposure to unspecified communicable disease: Secondary | ICD-10-CM

## 2019-04-12 DIAGNOSIS — R319 Hematuria, unspecified: Secondary | ICD-10-CM

## 2019-04-12 DIAGNOSIS — N760 Acute vaginitis: Secondary | ICD-10-CM

## 2019-04-12 MED ORDER — FLUCONAZOLE 150 MG TABLET
ORAL_TABLET | 0 refills | 0 days | Status: CP
Start: 2019-04-12 — End: ?

## 2019-04-12 MED ORDER — TRAMADOL 37.5 MG-ACETAMINOPHEN 325 MG TABLET
ORAL_TABLET | Freq: Four times a day (QID) | ORAL | 0 refills | 3.00000 days | Status: CP | PRN
Start: 2019-04-12 — End: ?

## 2019-04-12 MED ORDER — SULFAMETHOXAZOLE 800 MG-TRIMETHOPRIM 160 MG TABLET
ORAL_TABLET | Freq: Two times a day (BID) | ORAL | 0 refills | 3.00000 days | Status: CP
Start: 2019-04-12 — End: 2019-04-15

## 2019-11-09 ENCOUNTER — Ambulatory Visit: Admit: 2019-11-09 | Discharge: 2019-11-10 | Payer: MEDICAID

## 2019-11-09 DIAGNOSIS — L732 Hidradenitis suppurativa: Principal | ICD-10-CM

## 2020-02-07 ENCOUNTER — Ambulatory Visit: Admit: 2020-02-07 | Payer: MEDICAID

## 2020-02-20 ENCOUNTER — Ambulatory Visit: Payer: Self-pay

## 2020-05-10 ENCOUNTER — Ambulatory Visit: Payer: Medicaid Other

## 2020-06-18 ENCOUNTER — Ambulatory Visit: Admit: 2020-06-18 | Payer: MEDICAID

## 2020-08-27 ENCOUNTER — Encounter: Payer: Self-pay | Admitting: Physician Assistant

## 2020-08-27 ENCOUNTER — Other Ambulatory Visit: Payer: Self-pay

## 2020-08-27 ENCOUNTER — Ambulatory Visit: Payer: Medicaid Other

## 2020-08-27 ENCOUNTER — Ambulatory Visit (LOCAL_COMMUNITY_HEALTH_CENTER): Payer: Medicaid Other | Admitting: Physician Assistant

## 2020-08-27 VITALS — BP 107/72 | Ht 64.0 in | Wt 163.0 lb

## 2020-08-27 DIAGNOSIS — Z3009 Encounter for other general counseling and advice on contraception: Secondary | ICD-10-CM | POA: Diagnosis not present

## 2020-08-27 NOTE — Progress Notes (Unsigned)
Here for PE and Nexplanon. Nexplanon inserted 08/16/16 by KC.  Would like another Nexplanon. Declines Std screen. Richmond Campbell, RN   Per care everywhere patient had Nexplanon inserted 05/2017 at [facility]. Richmond Campbell, RN

## 2020-08-28 ENCOUNTER — Encounter: Payer: Self-pay | Admitting: Physician Assistant

## 2020-08-28 NOTE — Progress Notes (Signed)
Patient into clinic scheduled for IP/RP and Nexplanon removal.  Patient stated at first that she had Nexplanon placed in 2017, but per chart review in care everywhere the date was 05/17/2017.  Reviewed with patient history form and counseled that Nexplanon is good until 05/17/2021.   At this point the patient states that she wants to come back and have her physical and Nexplanon removal/reinsertion in September, closer to time for removal.  Patient declines any PE, screening labs or testing today and left the clinic room.

## 2021-03-17 ENCOUNTER — Other Ambulatory Visit: Payer: Self-pay

## 2021-03-17 ENCOUNTER — Ambulatory Visit
Admission: EM | Admit: 2021-03-17 | Discharge: 2021-03-17 | Disposition: A | Discharge status: Home / Self Care | Payer: Medicaid Other | Attending: Medical Oncology | Admitting: Medical Oncology

## 2021-03-17 ENCOUNTER — Encounter: Payer: Self-pay | Admitting: Emergency Medicine

## 2021-03-17 DIAGNOSIS — L03116 Cellulitis of left lower limb: Secondary | ICD-10-CM

## 2021-03-17 DIAGNOSIS — L03119 Cellulitis of unspecified part of limb: Secondary | ICD-10-CM | POA: Diagnosis not present

## 2021-03-17 DIAGNOSIS — L237 Allergic contact dermatitis due to plants, except food: Secondary | ICD-10-CM

## 2021-03-17 DIAGNOSIS — L03115 Cellulitis of right lower limb: Secondary | ICD-10-CM

## 2021-03-17 MED ORDER — MUPIROCIN 2 % EX OINT: 1.0000 "application " | TOPICAL_OINTMENT | Freq: Two times a day (BID) | CUTANEOUS | 0 refills | Status: DC

## 2021-03-17 MED ORDER — METHYLPREDNISOLONE SODIUM SUCC 125 MG IJ SOLR
125.0000 mg | Freq: Once | INTRAMUSCULAR | Status: AC
Start: 2021-03-17 — End: 2021-03-17
  Administered 2021-03-17: 125 mg via INTRAMUSCULAR

## 2021-03-17 MED ORDER — PREDNISONE 10 MG (21) PO TBPK: ORAL_TABLET | Freq: Every day | ORAL | 0 refills | Status: DC

## 2021-03-17 NOTE — ED Provider Notes (Signed)
[facility]    CSN: [id number] Arrival date & time: 03/17/21  1211      History   Chief Complaint Chief Complaint  Patient presents with   Rash    HPI Sally Kelley is a 30 y.o. female.   HPI  Rash: Patient reports that she has had a rash of her lower legs for the past few days.  She states that symptoms started after she was [place].  She states that the rash is very itchy.  Slowly trending up her legs.  She reports that she has tried multiple home remedies but has found that using a pumice stone and applying alcohol to the legs causes relief of the itching but does cause a burning sensation.  In a few areas there appears to be some yellowing scabs.  No fevers, history of MRSA or history of diabetes.  Past Medical History:  Diagnosis Date   Medical history non-contributory     Patient Active Problem List   Diagnosis Date Noted   Hidradenitis suppurativa 10/25/2017   Marijuana use 08/17/2016   Pre-eclampsia during pregnancy in third trimester, antepartum 08/16/2016   Alcohol consumption during pregnancy in first trimester 03/16/2016    Past Surgical History:  Procedure Laterality Date   INDUCED ABORTION  2011   WISDOM TOOTH EXTRACTION Left 2013    OB History     Gravida  2   Para  0   Term  0   Preterm  0   AB  1   Living  0      SAB  0   IAB  1   Ectopic  0   Multiple  0   Live Births  0            Home Medications    Prior to Admission medications   Medication Sig Start Date End Date Taking? Authorizing Provider  mupirocin ointment (BACTROBAN) 2 % Apply 1 application topically 2 (two) times daily. 03/17/21  Yes Jeffrie Stander M, PA-C  predniSONE (STERAPRED UNI-PAK 21 TAB) 10 MG (21) TBPK tablet Take by mouth daily. Take 6 tabs by mouth daily  for 2 days, then 5 tabs for 2 days, then 4 tabs for 2 days, then 3 tabs for 2 days, 2 tabs for 2 days, then 1 tab by mouth daily for 2 days 03/17/21  Yes Rushie Chestnut,  PA-C    Family History History reviewed. No pertinent family history.  Social History Social History   Tobacco Use   Smoking status: Every Day    Packs/day: 0.25    Types: Cigarettes   Smokeless tobacco: Never  Vaping Use   Vaping Use: Some days  Substance Use Topics   Alcohol use: No   Drug use: Yes    Types: Marijuana     Allergies   Patient has no known allergies.   Review of Systems Review of Systems  As stated above in HPI Physical Exam Triage Vital Signs ED Triage Vitals  Enc Vitals Group     BP 03/17/21 1401 (!) 132/102     Pulse Rate 03/17/21 1401 93     Resp 03/17/21 1401 18     Temp 03/17/21 1401 98.2 F (36.8 C)     Temp Source 03/17/21 1401 Oral     SpO2 03/17/21 1401 100 %     Weight 03/17/21 1359 162 lb 14.7 oz (73.9 kg)     Height 03/17/21 1359 5\' 4"  (1.626  m)     Head Circumference --      Peak Flow --      Pain Score 03/17/21 1359 0     Pain Loc --      Pain Edu? --      Excl. in GC? --    No data found.  Updated Vital Signs BP (!) 132/102 (BP Location: Left Arm)   Pulse 93   Temp 98.2 F (36.8 C) (Oral)   Resp 18   Ht 5\' 4"  (1.626 m)   Wt 162 lb 14.7 oz (73.9 kg)   LMP 02/24/2021 (Approximate)   SpO2 100%   BMI 27.97 kg/m   Physical Exam Vitals and nursing note reviewed.  HENT:     Mouth/Throat:     Pharynx: Oropharynx is clear.  Cardiovascular:     Rate and Rhythm: Normal rate and regular rhythm.     Heart sounds: Normal heart sounds.  Pulmonary:     Effort: Pulmonary effort is normal.     Breath sounds: Normal breath sounds.  Skin:    Comments: See below        UC Treatments / Results  Labs (all labs ordered are listed, but only abnormal results are displayed) Labs Reviewed - No data to display  EKG   Radiology No results found.  Procedures Procedures (including critical care time)  Medications Ordered in UC Medications  methylPREDNISolone sodium succinate (SOLU-MEDROL) 125 mg/2 mL injection 125  mg (has no administration in time range)    Initial Impression / Assessment and Plan / UC Course  I have reviewed the triage vital signs and the nursing notes.  Pertinent labs & imaging results that were available during my care of the patient were reviewed by me and considered in my medical decision making (see chart for details).     New.  We are going to treat for likely contact dermatitis given the pattern of the rash as well as for secondary infection from scratching.  Discussed our plan of action and she is agreeable to Solu-Medrol injection today followed by prednisone starting tomorrow.  Bactroban to be applied starting today.  Discussed how to use these medications along with common potential side effects and precautions.  Follow-up as needed. Final Clinical Impressions(s) / UC Diagnoses   Final diagnoses:  Allergic contact dermatitis due to plants, except food  Folliculitis   Discharge Instructions   None    ED Prescriptions     Medication Sig Dispense Auth. Provider   predniSONE (STERAPRED UNI-PAK 21 TAB) 10 MG (21) TBPK tablet Take by mouth daily. Take 6 tabs by mouth daily  for 2 days, then 5 tabs for 2 days, then 4 tabs for 2 days, then 3 tabs for 2 days, 2 tabs for 2 days, then 1 tab by mouth daily for 2 days 42 tablet Jaylei Fuerte M, PA-C   mupirocin ointment (BACTROBAN) 2 % Apply 1 application topically 2 (two) times daily. 22 g Deven Furia M, M      PDMP not reviewed this encounter.   [place], Rushie Chestnut 03/17/21 1422

## 2021-03-17 NOTE — ED Triage Notes (Signed)
Pt c/o rash on bilateral lower legs, and left forearm. Started about 2 days ago. She has tried multiple home remedies without relief.

## 2021-09-26 ENCOUNTER — Ambulatory Visit
Admission: EM | Admit: 2021-09-26 | Discharge: 2021-09-26 | Disposition: A | Discharge status: Home / Self Care | Payer: Medicaid Other | Attending: Emergency Medicine | Admitting: Emergency Medicine

## 2021-09-26 ENCOUNTER — Other Ambulatory Visit: Payer: Self-pay

## 2021-09-26 DIAGNOSIS — L247 Irritant contact dermatitis due to plants, except food: Secondary | ICD-10-CM | POA: Diagnosis not present

## 2021-09-26 MED ORDER — DEXAMETHASONE SODIUM PHOSPHATE 10 MG/ML IJ SOLN
10.0000 mg | Freq: Once | INTRAMUSCULAR | Status: AC
  Administered 2021-09-26: 10 mg via INTRAMUSCULAR

## 2021-09-26 MED ORDER — PREDNISONE 10 MG (21) PO TBPK: ORAL_TABLET | ORAL | 0 refills | Status: DC

## 2021-09-26 NOTE — ED Provider Notes (Signed)
[facility]    CSN: [id number] Arrival date & time: 09/26/21  1002      History   Chief Complaint Chief Complaint  Patient presents with   Rash    HPI Sally Kelley is a 31 y.o. female.   HPI  31 year old female here for evaluation of rash.  Reports that she is experiencing an itchy rash on both arms that started on her left upper arm.  It is spreading the side of her neck to her ears and the side of her face.  She reports that the area does use and when the drainage moved to a new area a new red itchy spot develops.  She states that she came in contact with a fuzzy brown vine and believes she been exposed to poison sumac.  She was exposed something very similar last summer and had a similar reaction on her legs.  She has been using baking soda and water to help dry up the lesions, this is caused some burning but it has helped.  She states that calamine and IV rest do not work for her.  She denies any swelling of her lips or tongue, and she denies any respiratory complaints.  Past Medical History:  Diagnosis Date   Medical history non-contributory     Patient Active Problem List   Diagnosis Date Noted   Hidradenitis suppurativa 10/25/2017   Marijuana use 08/17/2016   Pre-eclampsia during pregnancy in third trimester, antepartum 08/16/2016   Alcohol consumption during pregnancy in first trimester 03/16/2016    Past Surgical History:  Procedure Laterality Date   INDUCED ABORTION  2011   WISDOM TOOTH EXTRACTION Left 2013    OB History     Gravida  2   Para  0   Term  0   Preterm  0   AB  1   Living  0      SAB  0   IAB  1   Ectopic  0   Multiple  0   Live Births  0            Home Medications    Prior to Admission medications   Medication Sig Start Date End Date Taking? Authorizing Provider  predniSONE (STERAPRED UNI-PAK 21 TAB) 10 MG (21) TBPK tablet Take 6 tablets on day 1, 5 tablets day 2, 4 tablets day 3, 3 tablets day 4,  2 tablets day 5, 1 tablet day 6 09/26/21  Yes Becky Augusta, NP  mupirocin ointment (BACTROBAN) 2 % Apply 1 application topically 2 (two) times daily. 03/17/21   Rushie Chestnut, PA-C    Family History No family history on file.  Social History Social History   Tobacco Use   Smoking status: Every Day    Packs/day: 0.25    Types: Cigarettes   Smokeless tobacco: Never  Vaping Use   Vaping Use: Some days  Substance Use Topics   Alcohol use: No   Drug use: Yes    Types: Marijuana     Allergies   Poison sumac extract   Review of Systems Review of Systems  Constitutional:  Negative for fever.  HENT:  Negative for trouble swallowing.   Respiratory:  Negative for shortness of breath, wheezing and stridor.   Skin:  Positive for rash.  Hematological: Negative.   Psychiatric/Behavioral: Negative.      Physical Exam Triage Vital Signs ED Triage Vitals  Enc Vitals Group     BP 09/26/21 1010 127/82  Pulse Rate 09/26/21 1010 (!) 112     Resp 09/26/21 1010 18     Temp 09/26/21 1010 98 F (36.7 C)     Temp Source 09/26/21 1010 Oral     SpO2 09/26/21 1010 99 %     Weight 09/26/21 1007 162 lb 14.7 oz (73.9 kg)     Height 09/26/21 1007 5\' 4"  (1.626 m)     Head Circumference --      Peak Flow --      Pain Score 09/26/21 1007 0     Pain Loc --      Pain Edu? --      Excl. in GC? --    No data found.  Updated Vital Signs BP 127/82 (BP Location: Left Arm)    Pulse (!) 112    Temp 98 F (36.7 C) (Oral)    Resp 18    Ht 5\' 4"  (1.626 m)    Wt 162 lb 14.7 oz (73.9 kg)    LMP 09/03/2021 (Exact Date)    SpO2 99%    BMI 27.97 kg/m   Visual Acuity Right Eye Distance:   Left Eye Distance:   Bilateral Distance:    Right Eye Near:   Left Eye Near:    Bilateral Near:     Physical Exam Vitals and nursing note reviewed.  Constitutional:      General: She is not in acute distress.    Appearance: Normal appearance. She is not ill-appearing.  HENT:     Head: Normocephalic and  atraumatic.  Skin:    General: Skin is warm and dry.     Capillary Refill: Capillary refill takes less than 2 seconds.     Findings: Erythema and rash present.  Neurological:     General: No focal deficit present.     Mental Status: She is alert and oriented to person, place, and time.  Psychiatric:        Mood and Affect: Mood normal.        Behavior: Behavior normal.        Thought Content: Thought content normal.        Judgment: Judgment normal.     UC Treatments / Results  Labs (all labs ordered are listed, but only abnormal results are displayed) Labs Reviewed - No data to display  EKG   Radiology No results found.  Procedures Procedures (including critical care time)  Medications Ordered in UC Medications  dexamethasone (DECADRON) injection 10 mg (10 mg Intramuscular Given 09/26/21 1058)    Initial Impression / Assessment and Plan / UC Course  I have reviewed the triage vital signs and the nursing notes.  Pertinent labs & imaging results that were available during my care of the patient were reviewed by me and considered in my medical decision making (see chart for details).  Patient is a very pleasant, nontoxic-appearing 31 year old female here for evaluation of rashes on both of her arms, the size of her neck, and at both temples.  The rash is mildly erythematous and maculopapular in nature.  There is a larger patch on the left upper arm that does have some small fluid-filled vesicles in the middle.  The fluid is yellow in appearance.  No weeping is noticed on the lesions.  She also has lesions on both forearms.  She is not in any respiratory distress.  There is no rash on the anterior part of the face or near her eyes.  Patient exam is consistent with contact  dermatitis.  We will treat patient with a shot of Decadron here in clinic and discharged home on a prednisone taper to start tomorrow.  She can also use over-the-counter Allegra, Claritin, or Zyrtec as needed for  itching during the day and Benadryl at night.  I have also advised her to cover the lesions that weep to prevent spreading.  She can also use a pacemaker with baking soda and water to help dry up the lesions.  In addition, she can also use Pepcid 20 mg twice daily to help with itching as well.  Patient advised that if the rash spreads into her eyes that she needs to see an eye doctor or if she develops any facial swelling, tongue swelling, or difficulty breathing she is to go to the ER for evaluation.  Patient verbalizes understanding of same.   Final Clinical Impressions(s) / UC Diagnoses   Final diagnoses:  Irritant contact dermatitis due to plants, except food     Discharge Instructions      Take over-the-counter Allegra 180 mg daily or Zyrtec or Claritin 10 mg daily to help with your itching.  You can take over-the-counter Benadryl, 50 mg at bedtime, as needed for itching and sleep.  Take the prednisone pack according to the package instructions.  You will taken on tapering dose over a period of 6 days.  Take it with food and always take it first in the morning with breakfast.  Take over-the-counter Pepcid 20 mg twice daily to help with itching as well.  If the rash moves into your eyes you need to see an eye doctor.  If you develop any swelling of your lips or tongue, tightness in your throat, or difficulty breathing you need to go to the ER for evaluation.      ED Prescriptions     Medication Sig Dispense Auth. Provider   predniSONE (STERAPRED UNI-PAK 21 TAB) 10 MG (21) TBPK tablet Take 6 tablets on day 1, 5 tablets day 2, 4 tablets day 3, 3 tablets day 4, 2 tablets day 5, 1 tablet day 6 21 tablet Becky Augusta, NP      PDMP not reviewed this encounter.   Becky Augusta, NP 09/26/21 1100

## 2021-09-26 NOTE — ED Triage Notes (Signed)
Patient is here for "Rash'. Spots/bumps on both arms heading up to ears. No fever.

## 2021-09-26 NOTE — Discharge Instructions (Addendum)
Take over-the-counter Allegra 180 mg daily or Zyrtec or Claritin 10 mg daily to help with your itching.  You can take over-the-counter Benadryl, 50 mg at bedtime, as needed for itching and sleep.  Take the prednisone pack according to the package instructions.  You will taken on tapering dose over a period of 6 days.  Take it with food and always take it first in the morning with breakfast.  Take over-the-counter Pepcid 20 mg twice daily to help with itching as well.  If the rash moves into your eyes you need to see an eye doctor.  If you develop any swelling of your lips or tongue, tightness in your throat, or difficulty breathing you need to go to the ER for evaluation.

## 2021-11-22 ENCOUNTER — Encounter: Payer: Self-pay | Admitting: Emergency Medicine

## 2021-11-22 ENCOUNTER — Other Ambulatory Visit: Payer: Self-pay

## 2021-11-22 ENCOUNTER — Emergency Department
Admission: EM | Admit: 2021-11-22 | Discharge: 2021-11-22 | Disposition: A | Discharge status: Home / Self Care | Payer: Medicaid Other | Attending: Emergency Medicine | Admitting: Emergency Medicine

## 2021-11-22 DIAGNOSIS — W540XXA Bitten by dog, initial encounter: Secondary | ICD-10-CM | POA: Diagnosis not present

## 2021-11-22 DIAGNOSIS — S61012A Laceration without foreign body of left thumb without damage to nail, initial encounter: Secondary | ICD-10-CM | POA: Diagnosis not present

## 2021-11-22 DIAGNOSIS — F17210 Nicotine dependence, cigarettes, uncomplicated: Secondary | ICD-10-CM | POA: Diagnosis not present

## 2021-11-22 DIAGNOSIS — T148XXA Other injury of unspecified body region, initial encounter: Secondary | ICD-10-CM

## 2021-11-22 DIAGNOSIS — S60932A Unspecified superficial injury of left thumb, initial encounter: Secondary | ICD-10-CM | POA: Diagnosis present

## 2021-11-22 DIAGNOSIS — Z23 Encounter for immunization: Secondary | ICD-10-CM | POA: Diagnosis not present

## 2021-11-22 MED ORDER — TETANUS-DIPHTH-ACELL PERTUSSIS 5-2.5-18.5 LF-MCG/0.5 IM SUSY
0.5000 mL | PREFILLED_SYRINGE | Freq: Once | INTRAMUSCULAR | Status: AC
  Administered 2021-11-22: 0.5 mL via INTRAMUSCULAR
  Filled 2021-11-22: qty 0.5

## 2021-11-22 MED ORDER — MUPIROCIN 2 % EX OINT
1.0000 "application " | TOPICAL_OINTMENT | Freq: Once | CUTANEOUS | Status: DC
  Filled 2021-11-22: qty 22

## 2021-11-22 MED ORDER — MUPIROCIN 2 % EX OINT: 1.0000 "application " | TOPICAL_OINTMENT | Freq: Two times a day (BID) | CUTANEOUS | 0 refills | Status: AC

## 2021-11-22 MED ORDER — AMOXICILLIN-POT CLAVULANATE 875-125 MG PO TABS: 1.0000 | ORAL_TABLET | Freq: Two times a day (BID) | ORAL | 0 refills | Status: AC

## 2021-11-22 NOTE — ED Provider Notes (Signed)
? ?[facility] ?Emergency Department Provider Note ? ? ?____________________________________________ ? ? Event Date/Time  ? First MD Initiated Contact with Patient 11/22/21 1235   ?  (approximate) ? ?I have reviewed the triage vital signs and the nursing notes. ? ? ?HISTORY ? ?Chief Complaint ?Animal Bite ? ? ? ?HPI ?Sally Kelley patient reports that she was breaking up a fight between her dog and her cat.  When she did this she was bit by her dog or her cat she is unable to tell which one based on assessment of the wound I would suspect that it was her dog however we are unable to determine this. ?Reports that the dog and the cat/kitten are both hers.  The dog is fully vaccinated however the cat/kitten(6 months old) had no vaccines. ?Patient is noted to have a 1 inch laceration to base of left thumb.  She has a small scratch/abrasion to the right base of thumb. ?She denies any significant amount of pain rating it as a 1 out of 10. ?She has taken nothing for the pain and has not applied anything to the area.  She does report that she cleaned the area with soap and water prior to coming to the emergency room. ? ?Past Medical History:  ?Diagnosis Date  ? Medical history non-contributory   ? ? ?Patient Active Problem List  ? Diagnosis Date Noted  ? Hidradenitis suppurativa 10/25/2017  ? Marijuana use 08/17/2016  ? Pre-eclampsia during pregnancy in third trimester, antepartum 08/16/2016  ? Alcohol consumption during pregnancy in first trimester 03/16/2016  ? ? ?Past Surgical History:  ?Procedure Laterality Date  ? INDUCED ABORTION  2011  ? WISDOM TOOTH EXTRACTION Left 2013  ? ? ?Prior to Admission medications   ?Medication Sig Start Date End Date Taking? Authorizing Provider  ?amoxicillin-clavulanate (AUGMENTIN) 875-125 MG tablet Take 1 tablet by mouth 2 (two) times daily for 10 days. 11/22/21 12/02/21 Yes Herschell Dimeslapp, Albirtha Grinage J, NP  ?mupirocin ointment (BACTROBAN) 2 % Apply 1  application. topically 2 (two) times daily for 10 days. 11/22/21 12/02/21 Yes Herschell Dimeslapp, Aaryn Parrilla J, NP  ?predniSONE (STERAPRED UNI-PAK 21 TAB) 10 MG (21) TBPK tablet Take 6 tablets on day 1, 5 tablets day 2, 4 tablets day 3, 3 tablets day 4, 2 tablets day 5, 1 tablet day 6 09/26/21   Becky Augustayan, Jeremy, NP  ? ? ?Allergies ?Poison sumac extract ? ?History reviewed. No pertinent family history. ? ?Social History ?Social History  ? ?Tobacco Use  ? Smoking status: Every Day  ?  Packs/day: 0.25  ?  Types: Cigarettes  ? Smokeless tobacco: Never  ?Vaping Use  ? Vaping Use: Some days  ?Substance Use Topics  ? Alcohol use: No  ? Drug use: Yes  ?  Types: Marijuana  ? ? ?Review of Systems ? ?Constitutional: No fever/chills ?Eyes: No visual changes. ?ENT: No sore throat. ?Cardiovascular: Denies chest pain. ?Respiratory: Denies shortness of breath. ?Gastrointestinal: No abdominal pain.  No nausea, no vomiting.  No diarrhea.  No constipation. ?Genitourinary: Negative for dysuria. ?Musculoskeletal: Negative for back pain. ?Skin: Patient has 1 inch laceration to base of left thumb and also approximate 1 inch scratch/abrasion to base of right thumb. ?Neurological: Negative for headaches, focal weakness or numbness. ? ? ?____________________________________________ ? ? ?PHYSICAL EXAM: ? ?VITAL SIGNS: ?ED Triage Vitals  ?Enc Vitals Group  ?   BP 11/22/21 1134 (!) 135/91  ?   Pulse Rate 11/22/21 1134 87  ?  Resp 11/22/21 1134 16  ?   Temp 11/22/21 1136 98.3 ?F (36.8 ?C)  ?   Temp Source 11/22/21 1136 Oral  ?   SpO2 11/22/21 1134 98 %  ?   Weight 11/22/21 1137 157 lb (71.2 kg)  ?   Height 11/22/21 1137 5\' 4"  (1.626 m)  ?   Head Circumference --   ?   Peak Flow --   ?   Pain Score 11/22/21 1137 4  ?   Pain Loc --   ?   Pain Edu? --   ?   Excl. in GC? --   ? ? ?Constitutional: Alert and oriented. Well appearing and in no acute distress. ?Eyes: Conjunctivae are normal. PERRL. EOMI. ?Head: Atraumatic. ?Nose: No congestion/rhinnorhea. ?Mouth/Throat:  Mucous membranes are moist.  Oropharynx non-erythematous. ?Neck: No stridor.   ?Cardiovascular: Normal rate, regular rhythm. Grossly normal heart sounds.  Good peripheral circulation. ?Respiratory: Normal respiratory effort.  No retractions. Lungs CTAB. ?Gastrointestinal: Soft and nontender. No distention. No abdominal bruits. No CVA tenderness. ?Musculoskeletal: No lower extremity tenderness nor edema.  No joint effusions. ?Neurologic:  Normal speech and language. No gross focal neurologic deficits are appreciated. No gait instability. ?Skin:Patient has 1 inch laceration to base of left thumb and also approximate 1 inch scratch/abrasion to base of right thumb. ?Psychiatric: Mood and affect are normal. Speech and behavior are normal. ? ?____________________________________________ ?  ?LABS ?(all labs ordered are listed, but only abnormal results are displayed) ? ?Labs Reviewed - No data to display ?____________________________________________ ? ?EKG ? ? ?____________________________________________ ? ?RADIOLOGY ? ?ED MD interpretation:   ? ?Official radiology report(s): ?No results found. ? ?____________________________________________ ? ? ?PROCEDURES ? ?Procedure(s) performed: None ? ?Procedures ? ?Critical Care performed: No ? ?____________________________________________ ? ? ?INITIAL IMPRESSION / ASSESSMENT AND PLAN / ED COURSE ? ? ?  ? ?Sally Kelley is a 31 y.o. Kelley patient reports that she was breaking up a fight between her dog and her cat.  When she did this she was bit by her dog or her cat she is unable to tell which one based on assessment of the wound I would suspect that it was her dog however we are unable to determine this. ?Reports that the dog and the cat/kitten are both hers.  The dog is fully vaccinated however the cat/kitten(6 months old) had no vaccines. ?Patient is noted to have a 1 inch laceration to base of left thumb.  She has a small scratch/abrasion to the right base of thumb. ?She  denies any significant amount of pain rating it as a 1 out of 10. ?She has taken nothing for the pain and has not applied anything to the area.  She does report that she cleaned the area with soap and water prior to coming to the emergency room. ?Patient is not current on tetanus shot. ? ?Will have nursing staff to clean the area and apply bacitracin ointment to the area with clean dressing. ?I have advised patient that she will need to keep area clean with soap and water at least twice a day and apply antibiotic ointment to the area/keep clean dry and covered.  If she notices any redness/swelling/drainage from the area she is to return to her primary care doctor or the emergency room for reevaluation promptly. ?The laceration to base of left thumb will not be closed today due to risk of infection. ?Patient will also receive tetanus shot while in the emergency room today. ?Patient declines rabies vaccine today.  States that she would prefer to quarantine animals for 3 days and return if animal show signs of rabies. ?[facility] Dept department was notified of animal bite.  They state that they will go to patient's home to collect report later today. ? ?Today I will order tetanus shot ?Pt will be discharged home with prescription for Augmentin and antibiotic ointment. ?  ? ? ?____________________________________________ ? ? ?FINAL CLINICAL IMPRESSION(S) / ED DIAGNOSES ? ?Final diagnoses:  ?Animal bite  ? ? ? ?ED Discharge Orders   ? ?      Ordered  ?  mupirocin ointment (BACTROBAN) 2 %  2 times daily       ? 11/22/21 1310  ?  amoxicillin-clavulanate (AUGMENTIN) 875-125 MG tablet  2 times daily       ? 11/22/21 1310  ? ?  ?  ? ?  ? ? ? ?Note:  This document was prepared using Dragon voice recognition software and may include unintentional dictation errors. ? ? ?  ?Herschell Dimes, NP ?11/22/21 1311 ? ?  ?Sharman Cheek, MD ?11/22/21 1545 ? ?

## 2021-11-22 NOTE — Discharge Instructions (Addendum)
You have been seen in the emergency room today for an animal bite.  You will need to clean the area twice a day with soap and water and apply antibiotic ointment that I have prescribed for you.  Please keep the area clean/dry and covered.  You also be given a prescription for an oral antibiotic that you will take twice a day for the next 10 days.  Please return to the emergency room and/or urgent care if you start to have any redness/swelling/drainage from the area. ?Animal control will be contacting you later today to collect report. ?

## 2021-11-22 NOTE — ED Triage Notes (Signed)
Pt via POV from home. Pt states that it is unknown if her dog or cat bit her L wrist. States that the cat does not have its shots. Unknown last tetanus. Unknown last tetanus. Pt is A&OX4 and NAD ?

## 2022-03-19 ENCOUNTER — Ambulatory Visit: Admit: 2022-03-19 | Discharge: 2022-03-20 | Payer: PRIVATE HEALTH INSURANCE

## 2022-03-19 DIAGNOSIS — R0981 Nasal congestion: Principal | ICD-10-CM

## 2022-03-19 DIAGNOSIS — B9689 Other specified bacterial agents as the cause of diseases classified elsewhere: Principal | ICD-10-CM

## 2022-03-19 DIAGNOSIS — F17200 Nicotine dependence, unspecified, uncomplicated: Principal | ICD-10-CM

## 2022-03-19 DIAGNOSIS — J208 Acute bronchitis due to other specified organisms: Principal | ICD-10-CM

## 2022-03-19 MED ORDER — DOXYCYCLINE HYCLATE 100 MG CAPSULE
ORAL_CAPSULE | Freq: Two times a day (BID) | ORAL | 0 refills | 10.00000 days | Status: CP
Start: 2022-03-19 — End: 2022-03-29

## 2022-03-19 MED ORDER — BENZONATATE 200 MG CAPSULE
ORAL_CAPSULE | Freq: Three times a day (TID) | ORAL | 0 refills | 7.00000 days | Status: CP | PRN
Start: 2022-03-19 — End: 2022-03-26

## 2022-04-07 ENCOUNTER — Ambulatory Visit
Admit: 2022-04-07 | Discharge: 2022-04-07 | Disposition: A | Discharge status: Home / Self Care | Payer: PRIVATE HEALTH INSURANCE | Attending: Family

## 2022-04-07 DIAGNOSIS — L739 Follicular disorder, unspecified: Principal | ICD-10-CM

## 2022-04-07 DIAGNOSIS — L03317 Cellulitis of buttock: Principal | ICD-10-CM

## 2022-04-07 MED ORDER — SULFAMETHOXAZOLE 800 MG-TRIMETHOPRIM 160 MG TABLET
ORAL_TABLET | Freq: Two times a day (BID) | ORAL | 0 refills | 7 days | Status: CP
Start: 2022-04-07 — End: 2022-04-14

## 2022-07-02 ENCOUNTER — Ambulatory Visit: Payer: Medicaid Other

## 2022-07-23 ENCOUNTER — Ambulatory Visit (LOCAL_COMMUNITY_HEALTH_CENTER): Payer: Medicaid Other | Admitting: Nurse Practitioner

## 2022-07-23 ENCOUNTER — Ambulatory Visit: Payer: Medicaid Other | Admitting: Nurse Practitioner

## 2022-07-23 VITALS — BP 143/96 | Ht 64.0 in | Wt 157.2 lb

## 2022-07-23 DIAGNOSIS — Z309 Encounter for contraceptive management, unspecified: Secondary | ICD-10-CM

## 2022-07-23 DIAGNOSIS — Z30017 Encounter for initial prescription of implantable subdermal contraceptive: Secondary | ICD-10-CM

## 2022-07-23 DIAGNOSIS — Z3046 Encounter for surveillance of implantable subdermal contraceptive: Secondary | ICD-10-CM

## 2022-07-23 DIAGNOSIS — T7411XS Adult physical abuse, confirmed, sequela: Secondary | ICD-10-CM

## 2022-07-23 DIAGNOSIS — Z3009 Encounter for other general counseling and advice on contraception: Secondary | ICD-10-CM

## 2022-07-23 DIAGNOSIS — Z01419 Encounter for gynecological examination (general) (routine) without abnormal findings: Secondary | ICD-10-CM

## 2022-07-23 MED ORDER — ETONOGESTREL 68 MG ~~LOC~~ IMPL
68.0000 mg | DRUG_IMPLANT | Freq: Once | SUBCUTANEOUS | Status: AC
  Administered 2022-07-23: 68 mg via SUBCUTANEOUS
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
## 2022-07-23 NOTE — Progress Notes (Signed)
See other note. Yoona Ishii, FNP  

## 2022-07-28 ENCOUNTER — Ambulatory Visit: Admit: 2022-07-28 | Payer: PRIVATE HEALTH INSURANCE

## 2022-07-29 LAB — IGP, APTIMA HPV
HPV Aptima: POSITIVE — AB
PAP Smear Comment: 0

## 2022-07-31 ENCOUNTER — Telehealth: Payer: Self-pay

## 2022-07-31 NOTE — Telephone Encounter (Signed)
Phone call to pt at [phone]. Left message that RN with [facility] is calling in regard to pap smear result, will be receiving card in the mail indicating follow-up is needed. Would like to talk re where she would like to go as far as an obgyn to have them take a closer look and further evaluation of cervix. Please call Ehren Berisha at [phone].  (Confirm Insurance? And where for colpo?)

## 2022-07-31 NOTE — Telephone Encounter (Signed)
-  Needs colpo -Does she currently have medicaid (other than Family planning)? -if no insurance/regular type medicaid, okay with [facility]? -if insurance, where does pt want to go for colpo?

## 2022-07-31 NOTE — Telephone Encounter (Signed)
Calling pt re pap result from 07/23/22 specimen and colpo referral: ASCUS, Positive HPV  Pap card mailed 07/31/22.  Confirm insurance and where pt would like to go for colpo.

## 2022-08-05 ENCOUNTER — Telehealth: Payer: Self-pay | Admitting: Family Medicine

## 2022-08-05 NOTE — Telephone Encounter (Signed)
Patient states that she received a letter in in the mail regarding abnormal results. She wants to speak to someone in the clinic about this. Please call.

## 2022-08-11 ENCOUNTER — Ambulatory Visit
Admit: 2022-08-11 | Discharge: 2022-08-12 | Payer: PRIVATE HEALTH INSURANCE | Attending: Student in an Organized Health Care Education/Training Program | Primary: Student in an Organized Health Care Education/Training Program

## 2022-08-11 DIAGNOSIS — F1290 Cannabis use, unspecified, uncomplicated: Principal | ICD-10-CM

## 2022-08-11 DIAGNOSIS — R739 Hyperglycemia, unspecified: Principal | ICD-10-CM

## 2022-08-11 DIAGNOSIS — R87619 Unspecified abnormal cytological findings in specimens from cervix uteri: Principal | ICD-10-CM

## 2022-08-11 DIAGNOSIS — Z113 Encounter for screening for infections with a predominantly sexual mode of transmission: Principal | ICD-10-CM

## 2022-08-11 DIAGNOSIS — Z13220 Encounter for screening for lipoid disorders: Principal | ICD-10-CM

## 2022-08-11 DIAGNOSIS — R7401 Transaminitis: Principal | ICD-10-CM

## 2022-08-11 DIAGNOSIS — D72829 Elevated white blood cell count, unspecified: Principal | ICD-10-CM

## 2022-08-11 DIAGNOSIS — R030 Elevated blood-pressure reading, without diagnosis of hypertension: Principal | ICD-10-CM

## 2022-08-11 DIAGNOSIS — R4189 Other symptoms and signs involving cognitive functions and awareness: Principal | ICD-10-CM

## 2022-08-11 DIAGNOSIS — T7491XS Unspecified adult maltreatment, confirmed, sequela: Principal | ICD-10-CM

## 2022-08-11 DIAGNOSIS — F17200 Nicotine dependence, unspecified, uncomplicated: Principal | ICD-10-CM

## 2022-08-11 DIAGNOSIS — Z1159 Encounter for screening for other viral diseases: Principal | ICD-10-CM

## 2022-08-12 DIAGNOSIS — B1920 Unspecified viral hepatitis C without hepatic coma: Principal | ICD-10-CM

## 2022-08-13 DIAGNOSIS — F1290 Cannabis use, unspecified, uncomplicated: Principal | ICD-10-CM

## 2022-08-13 DIAGNOSIS — F17200 Nicotine dependence, unspecified, uncomplicated: Principal | ICD-10-CM

## 2022-08-13 DIAGNOSIS — R87619 Unspecified abnormal cytological findings in specimens from cervix uteri: Principal | ICD-10-CM

## 2022-08-13 DIAGNOSIS — Z789 Other specified health status: Principal | ICD-10-CM

## 2022-08-13 DIAGNOSIS — B182 Chronic viral hepatitis C: Principal | ICD-10-CM

## 2022-08-13 DIAGNOSIS — B1920 Unspecified viral hepatitis C without hepatic coma: Principal | ICD-10-CM

## 2022-08-19 NOTE — Telephone Encounter (Signed)
Phone call to pt at [phone], unable to leave voicemail. SMS notification sent with call back number of [phone]. Tried twice. MyChart not available.

## 2022-08-20 NOTE — Telephone Encounter (Signed)
Phone call to [phone]. Left voicemail message that RN with [facility] is calling re pap smear result and referral. Please call Mckynzie Liwanag at [phone].

## 2022-08-25 NOTE — Telephone Encounter (Signed)
Certified letter mailed to pt re abnormal pap and need for further testing - see scanned copy.

## 2022-09-03 ENCOUNTER — Ambulatory Visit: Admit: 2022-09-03 | Discharge: 2022-09-04 | Payer: PRIVATE HEALTH INSURANCE

## 2022-09-03 DIAGNOSIS — B1920 Unspecified viral hepatitis C without hepatic coma: Principal | ICD-10-CM

## 2022-09-04 DIAGNOSIS — K760 Fatty (change of) liver, not elsewhere classified: Principal | ICD-10-CM

## 2022-09-04 DIAGNOSIS — K8020 Calculus of gallbladder without cholecystitis without obstruction: Principal | ICD-10-CM

## 2022-09-04 DIAGNOSIS — R768 Other specified abnormal immunological findings in serum: Principal | ICD-10-CM

## 2022-09-04 DIAGNOSIS — K7689 Other specified diseases of liver: Principal | ICD-10-CM

## 2022-09-07 ENCOUNTER — Ambulatory Visit
Admit: 2022-09-07 | Discharge: 2022-09-08 | Payer: PRIVATE HEALTH INSURANCE | Attending: Student in an Organized Health Care Education/Training Program | Primary: Student in an Organized Health Care Education/Training Program

## 2022-09-07 DIAGNOSIS — R768 Other specified abnormal immunological findings in serum: Principal | ICD-10-CM

## 2022-09-07 DIAGNOSIS — E781 Pure hyperglyceridemia: Principal | ICD-10-CM

## 2022-09-07 DIAGNOSIS — K7689 Other specified diseases of liver: Principal | ICD-10-CM

## 2022-09-07 DIAGNOSIS — R87619 Unspecified abnormal cytological findings in specimens from cervix uteri: Principal | ICD-10-CM

## 2022-09-07 DIAGNOSIS — R030 Elevated blood-pressure reading, without diagnosis of hypertension: Principal | ICD-10-CM

## 2022-09-07 DIAGNOSIS — F17200 Nicotine dependence, unspecified, uncomplicated: Principal | ICD-10-CM

## 2022-09-07 DIAGNOSIS — K760 Fatty (change of) liver, not elsewhere classified: Principal | ICD-10-CM

## 2022-09-30 ENCOUNTER — Encounter: Payer: Self-pay | Admitting: Advanced Practice Midwife

## 2022-09-30 DIAGNOSIS — R030 Elevated blood-pressure reading, without diagnosis of hypertension: Secondary | ICD-10-CM | POA: Insufficient documentation

## 2022-09-30 DIAGNOSIS — R87619 Unspecified abnormal cytological findings in specimens from cervix uteri: Secondary | ICD-10-CM | POA: Insufficient documentation

## 2022-10-20 ENCOUNTER — Other Ambulatory Visit: Payer: Self-pay

## 2022-10-20 ENCOUNTER — Ambulatory Visit
Admission: EM | Admit: 2022-10-20 | Discharge: 2022-10-20 | Disposition: A | Discharge status: Home / Self Care | Payer: Medicaid Other

## 2022-10-20 DIAGNOSIS — H109 Unspecified conjunctivitis: Secondary | ICD-10-CM | POA: Diagnosis not present

## 2022-10-20 DIAGNOSIS — S0502XA Injury of conjunctiva and corneal abrasion without foreign body, left eye, initial encounter: Secondary | ICD-10-CM | POA: Diagnosis not present

## 2022-10-20 DIAGNOSIS — B9689 Other specified bacterial agents as the cause of diseases classified elsewhere: Secondary | ICD-10-CM

## 2022-10-20 MED ORDER — SYSTANE NIGHTTIME OP OINT: 1.0000 | TOPICAL_OINTMENT | Freq: Two times a day (BID) | OPHTHALMIC | 0 refills | Status: AC | PRN

## 2022-10-20 MED ORDER — OFLOXACIN 0.3 % OP SOLN: 1.0000 [drp] | Freq: Four times a day (QID) | OPHTHALMIC | 0 refills | Status: AC

## 2022-10-20 NOTE — ED Provider Notes (Signed)
[facility]    CSN: [id number] Arrival date & time: 10/20/22  1548      History   Chief Complaint Chief Complaint  Patient presents with   Eye Problem    HPI Sally Kelley is a 32 y.o. female.   Patient presents today accompanied by her mother.  Reports that for the past 2 to 3 days she has had worsening irritation and purulent drainage from bilateral eyes.  This started on the right and then spread to the left.  She believes that she injured her eye/scratched her cornea as she had been using a Q-tip to remove the drainage and is now experiencing discomfort/pain in this eye.  She denies any visual disturbance, photophobia, headache, nausea, vomiting.  Reports her daughter had similar symptoms and prescribed antibiotic drops with resolution of symptoms.  She does not wear contacts.  She has tried a couple doses of Polytrim from leftover prescription but does not use any over-the-counter medication for symptom management.    Past Medical History:  Diagnosis Date   Medical history non-contributory    Preeclampsia complicating hypertension     Patient Active Problem List   Diagnosis Date Noted   Abnormal Pap smear of cervix 07/23/22 ASCUS HPV+ 09/30/2022   Elevated blood pressure reading 143/96 on 07/23/22 09/30/2022   Hidradenitis suppurativa 10/25/2017   Marijuana use 08/17/2016   Pre-eclampsia during pregnancy in third trimester, antepartum 08/16/2016   Alcohol consumption during pregnancy in first trimester 03/16/2016    Past Surgical History:  Procedure Laterality Date   INDUCED ABORTION  2011   WISDOM TOOTH EXTRACTION Left 2013    OB History     Gravida  2   Para  1   Term  1   Preterm  0   AB  1   Living  0      SAB  0   IAB  1   Ectopic  0   Multiple  0   Live Births  0            Home Medications    Prior to Admission medications   Medication Sig Start Date End Date Taking? Authorizing Provider  Etonogestrel (NEXPLANON  [place]) Inject into the skin.   Yes [provider]  ofloxacin (OCUFLOX) 0.3 % ophthalmic solution Place 1 drop into both eyes 4 (four) times daily. 10/20/22  Yes Kyndal Gloster K, PA-C  White Petrolatum-Mineral Oil (SYSTANE NIGHTTIME) OINT Apply 1 Application to eye 2 (two) times daily as needed. 10/20/22  Yes Elliana Bal, Derry Skill, PA-C    Family History Family History  Problem Relation Age of Onset   COPD Mother        Pericardial Effusion   Bone cancer Maternal Aunt     Social History Social History   Tobacco Use   Smoking status: Every Day    Packs/day: 0.50    Years: 20.00    Total pack years: 10.00    Types: Cigarettes   Smokeless tobacco: Never  Vaping Use   Vaping Use: Former  Substance Use Topics   Alcohol use: Yes    Alcohol/week: 8.0 standard drinks of alcohol    Types: 8 Standard drinks or equivalent per week    Comment: twisted tea twice weekly   Drug use: Yes    Types: Marijuana    Comment: Daily     Allergies   Poison sumac extract and Acetaminophen   Review of Systems Review of Systems  Constitutional:  Negative  for activity change, appetite change, fatigue and fever.  Eyes:  Positive for pain, discharge and redness. Negative for photophobia, itching and visual disturbance.  Gastrointestinal:  Negative for abdominal pain, diarrhea, nausea and vomiting.  Neurological:  Negative for dizziness, light-headedness and headaches.     Physical Exam Triage Vital Signs ED Triage Vitals  Enc Vitals Group     BP 10/20/22 1612 (!) 130/99     Pulse Rate 10/20/22 1612 (!) 109     Resp 10/20/22 1612 17     Temp 10/20/22 1612 98.3 F (36.8 C)     Temp Source 10/20/22 1612 Oral     SpO2 10/20/22 1612 100 %     Weight 10/20/22 1607 157 lb (71.2 kg)     Height 10/20/22 1607 '5\' 4"'$  (1.626 m)     Head Circumference --      Peak Flow --      Pain Score 10/20/22 1607 4     Pain Loc --      Pain Edu? --      Excl. in [place]? --    No data found.  Updated Vital  Signs BP (!) 130/99 (BP Location: Left Arm)   Pulse (!) 109   Temp 98.3 F (36.8 C) (Oral)   Resp 17   Ht '5\' 4"'$  (1.626 m)   Wt 157 lb (71.2 kg)   LMP 09/28/2022   SpO2 100%   BMI 26.95 kg/m   Visual Acuity Right Eye Distance:   Left Eye Distance:   Bilateral Distance:    Right Eye Near:   Left Eye Near:    Bilateral Near:     Physical Exam Vitals reviewed.  Constitutional:      General: She is awake. She is not in acute distress.    Appearance: Normal appearance. She is well-developed. She is not ill-appearing.     Comments: Very pleasant female appears stated age in no acute distress sitting comfortably in exam room  HENT:     Head: Normocephalic and atraumatic.  Eyes:     Extraocular Movements: Extraocular movements intact.     Conjunctiva/sclera:     Right eye: Right conjunctiva is injected. No chemosis.    Left eye: Left conjunctiva is injected. Chemosis present.     Pupils: Pupils are equal, round, and reactive to light.     Left eye: Corneal abrasion present. No fluorescein uptake. Seidel exam negative.     Comments: Erythema to bilateral conjunctiva.  Significant purulent drainage noted bilateral eyes.  Corneal abrasion noted left eye.  Resolution of discomfort following application of tetracaine.  Cardiovascular:     Rate and Rhythm: Normal rate and regular rhythm.     Heart sounds: Normal heart sounds, S1 normal and S2 normal. No murmur heard. Pulmonary:     Effort: Pulmonary effort is normal.     Breath sounds: Normal breath sounds. No wheezing, rhonchi or rales.     Comments: Clear to auscultation bilaterally Psychiatric:        Behavior: Behavior is cooperative.      UC Treatments / Results  Labs (all labs ordered are listed, but only abnormal results are displayed) Labs Reviewed - No data to display  EKG   Radiology No results found.  Procedures Procedures (including critical care time)  Medications Ordered in UC Medications - No data to  display  Initial Impression / Assessment and Plan / UC Course  I have reviewed the triage vital signs and the nursing notes.  Pertinent labs & imaging results that were available during my care of the patient were reviewed by me and considered in my medical decision making (see chart for details).     Patient is mildly tachycardic but review of past visits that shows this is her baseline.  She is otherwise well-appearing and nontoxic.  Fluorescein staining of left eye showed oral abrasion.  Recommended that she avoid using Q-tips around her eyes or any harsh tissue.  Recommended that she use lubricating eyedrops for additional symptom relief.  Systane ophthalmic ointment was sent to the pharmacy to provide lubrication and comfort.  Will start ofloxacin drops in bilateral eyes.  Recommend that she keep her eyes closed and use a cool compress for additional symptom relief.  If her symptoms or not improving she is to follow-up with ophthalmology was given contact information for local provider with instruction to call to schedule appointment.  Discussed that if she has any worsening symptoms including vision change, increasing pain, headache, fever, nausea, vomiting she needs to be seen immediately.  Strict return precautions given.  Patient declined work excuse note.  Final Clinical Impressions(s) / UC Diagnoses   Final diagnoses:  Bacterial conjunctivitis of both eyes  Abrasion of left cornea, initial encounter     Discharge Instructions      Use ofloxacin drops 4 times daily.  You can use over-the-counter lubricating eyedrops for additional symptom relief.  I have sent in an ophthalmic ointment to help coat your eye so use this 1-2 times a day.  Use a cool compress on your eyes and try to keep them closed.  If your symptoms or not improving within a few days please follow-up with ophthalmology.  If anything worsens and you have worsening pain, vision change, headache, fever, nausea, vomiting  you need to be seen immediately.     ED Prescriptions     Medication Sig Dispense Auth. Provider   ofloxacin (OCUFLOX) 0.3 % ophthalmic solution Place 1 drop into both eyes 4 (four) times daily. 5 mL Myna Freimark K, PA-C   White Petrolatum-Mineral Oil (SYSTANE NIGHTTIME) OINT Apply 1 Application to eye 2 (two) times daily as needed. 3.5 g Brinlee Gambrell K, PA-C      PDMP not reviewed this encounter.   Terrilee Croak, PA-C 10/20/22 1640

## 2022-10-20 NOTE — Discharge Instructions (Signed)
Use ofloxacin drops 4 times daily.  You can use over-the-counter lubricating eyedrops for additional symptom relief.  I have sent in an ophthalmic ointment to help coat your eye so use this 1-2 times a day.  Use a cool compress on your eyes and try to keep them closed.  If your symptoms or not improving within a few days please follow-up with ophthalmology.  If anything worsens and you have worsening pain, vision change, headache, fever, nausea, vomiting you need to be seen immediately.

## 2022-10-20 NOTE — ED Triage Notes (Addendum)
Pt reports her daughter had pink eye and 2-3 days ago she started having eyes matted shut and watery, red eyes. Yesterday she used a Qtip in her eye and now has green drainage coming from her eye. Pt states she's been doing yard work all day  VISUAL ACUITY  Right eye uncorrected 20/40 -1 Left eye uncorrected 20/40 -1 Both eyes uncorrected 20/30 -1

## 2022-10-21 ENCOUNTER — Ambulatory Visit: Payer: Self-pay

## 2022-12-04 ENCOUNTER — Ambulatory Visit: Admit: 2022-12-04 | Discharge: 2022-12-05 | Payer: PRIVATE HEALTH INSURANCE

## 2022-12-24 ENCOUNTER — Ambulatory Visit
Admit: 2022-12-24 | Payer: PRIVATE HEALTH INSURANCE | Attending: Student in an Organized Health Care Education/Training Program | Primary: Student in an Organized Health Care Education/Training Program

## 2022-12-28 ENCOUNTER — Ambulatory Visit
Admit: 2022-12-28 | Discharge: 2022-12-29 | Payer: PRIVATE HEALTH INSURANCE | Attending: Student in an Organized Health Care Education/Training Program | Primary: Student in an Organized Health Care Education/Training Program

## 2022-12-28 DIAGNOSIS — R142 Eructation: Principal | ICD-10-CM

## 2022-12-28 DIAGNOSIS — R768 Other specified abnormal immunological findings in serum: Principal | ICD-10-CM

## 2022-12-28 DIAGNOSIS — F419 Anxiety disorder, unspecified: Principal | ICD-10-CM

## 2022-12-28 DIAGNOSIS — E781 Pure hyperglyceridemia: Principal | ICD-10-CM

## 2022-12-28 DIAGNOSIS — R030 Elevated blood-pressure reading, without diagnosis of hypertension: Principal | ICD-10-CM

## 2022-12-28 MED ORDER — FAMOTIDINE 20 MG TABLET
ORAL_TABLET | Freq: Two times a day (BID) | ORAL | 0 refills | 90 days | Status: CP
Start: 2022-12-28 — End: 2023-03-28

## 2023-01-05 ENCOUNTER — Ambulatory Visit: Admit: 2023-01-05 | Discharge: 2023-01-05 | Payer: PRIVATE HEALTH INSURANCE

## 2023-01-05 DIAGNOSIS — R768 Other specified abnormal immunological findings in serum: Principal | ICD-10-CM

## 2023-01-05 DIAGNOSIS — K7689 Other specified diseases of liver: Principal | ICD-10-CM

## 2023-01-05 DIAGNOSIS — K8020 Calculus of gallbladder without cholecystitis without obstruction: Principal | ICD-10-CM

## 2023-01-05 DIAGNOSIS — K760 Fatty (change of) liver, not elsewhere classified: Principal | ICD-10-CM

## 2023-01-08 DIAGNOSIS — B182 Chronic viral hepatitis C: Principal | ICD-10-CM

## 2023-01-08 MED ORDER — GLECAPREVIR 100 MG-PIBRENTASVIR 40 MG TABLET
Freq: Every day | ORAL | 0 refills | 56 days | Status: CP
Start: 2023-01-08 — End: ?
  Filled 2023-01-12: qty 2, 56d supply, fill #0

## 2023-01-08 NOTE — Unmapped (Signed)
Counseling for HCV treatment     Melinda Tyler is 32 y.o. who presents to clinic on 01/05/23 to see Ms. Harms and is interested in starting HCV treatment. Reviewed treatment option with Mavyret. We discussed the prior authorization (PA) process of obtaining the medication through insurance and that this may take some time.      B18.2 Hep C: yes    K74.60 Cirrhosis: no,   Child Pugh Score if applicable and for Medicaid pts: 5  Z94.4 Liver Transplant: no    Genotype: 1a (08/11/22)  HCV RNA: 494,272IU/ml on 01/05/23   Fibrosis score: FIB-4 0.24 based on labs 12/28/22 &08/11/22  HIV Co-infection? Nonreactive 08/11/22  Signs of liver decompensation? no  Previous treatment? naive    Planned regimen: Mavyret (glecaprevir/pibrentasvir 100/40 mg) 3 tabs daily x 8 weeks  Urgency: Routine Request    Prescribing Provider/NPI: T. Lindell Spar, NP / [id number]  Supervising Physician/NPI: Dr. Fidela Juneau / [id number]  Signature waiver form not obtained at this time.  Insurance: [place] Medicaid    Current medications: pepcid, Nexplanon    Following topics were discussed during counseling:    1. Indications for medication, dosage and administration.     A. Mavyret (100/40 mg) 3 tablets to take once daily with food.      2. Common and rare side effects of medications and management strategies.   A. Fatigue and headache  B. Risk of hepatic decompensation/failure in patients with evidence of advanced liver disease.   C. Pregnancy precaution given: has nexplanon     3. Importance of adherence to regimen, follow-up clinic visits and lab monitoring.     A.  Reviewed treatment follow up protocol including TW#4 and Post-TW#12 to assess for cure.     4. Drug-drug interaction.    A. Current medications have been reviewed and assessed for possible interaction.   B. Denies use of herbal medication such as milk thistle or [place]. John's wart.  Allergies have been verified. Denies alcohol.     5. Importance of informing pharmacy and clinic of updated contact information.  Discussed the process of obtaining medication through specialty pharmacy and when approved medication will be delivered to patient's home. Stressed importance of being able to be reached over the phone. Gives permission to speak with mother, Liborio Nixon.    Patient verbalized understanding. Provided contact information for any questions/concerns.       Park Breed, Pharm D., BCPS, BCGP, CPP  [facility]  [address]  [place], [place] [address]  [phone]

## 2023-01-08 NOTE — Unmapped (Signed)
[facility] Specialty Medication Onboarding    Specialty Medication: MAVYRET 100-40 mg tablet (glecaprevir-pibrentasvir)  Prior Authorization: Not Required   Financial Assistance: No - copay  <$25  Final Copay/Day Supply: $4 / 56    Insurance Restrictions: None     Notes to Pharmacist:   Credit Card on File: no    The triage team has completed the benefits investigation and has determined that the patient is able to fill this medication at [facility]. Please contact the patient to complete the onboarding or follow up with the prescribing physician as needed.
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
## 2023-01-09 LAB — PHOSPHATIDYLETHANOL (PETH)
PETH 16:0/18:2 (PLPETH) BY LC-MS/MS: 201 ng/mL
PETH INTERPRETATION: POSITIVE

## 2023-01-13 NOTE — Unmapped (Signed)
[facility]  01/13/2023     Background   32 y.o. female with hx of  history of alcohol abuse, daily marijuana use, hypertension, hidradenitis, hypertriglyceridemia, anxiety,  who was seen for Hepatitis C. Prior imaging was reviewed    Imaging reviewed:   12/04/2022 MRCP small biliary cyst.     Findings  Biliary cyst without concerning findings    Recommendations   Repeat imaging in 12 months

## 2023-01-25 ENCOUNTER — Ambulatory Visit
Admit: 2023-01-25 | Payer: PRIVATE HEALTH INSURANCE | Attending: Student in an Organized Health Care Education/Training Program | Primary: Student in an Organized Health Care Education/Training Program

## 2023-01-26 NOTE — Unmapped (Signed)
Follow up for HCV treatment    Primary Hepatology provider: Priscille Heidelberg, NP  Diagnosis: Chronic hep C  Fibrosis: FIB-4 0.24 based on labs 12/28/22 &08/11/22   Current regimen: Mavyret x 8 wks  Anticipated start date: 01/13/23??  Pharmacy: [facility] [phone] option #4, then option 4    Left message for pt to return my call on 01/26/23. Unable to leave message on 01/28/23 and 02/01/23 as VM is full. Pt has TW#4 appt with Priscille Heidelberg on 02/16/23.    Park Breed, Pharm D., BCPS, BCGP, CPP  [facility]  [address]  [place], [place] [address]  [phone]

## 2023-02-12 NOTE — Unmapped (Signed)
Specialty Medication(s): Mavyret 100-40mg     Ms.Halsted has been dis-enrolled from the [facility] specialty pharmacy services due to the upcoming therapy completion - expected therapy completion date: 03/08/23 .    Additional information provided to the patient: n/a    Roderic Palau, PharmD  [facility] Specialty Pharmacist
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
## 2023-03-04 ENCOUNTER — Ambulatory Visit: Admit: 2023-03-04 | Discharge: 2023-03-05 | Payer: PRIVATE HEALTH INSURANCE

## 2023-03-04 DIAGNOSIS — K760 Fatty (change of) liver, not elsewhere classified: Principal | ICD-10-CM

## 2023-03-04 DIAGNOSIS — K862 Cyst of pancreas: Principal | ICD-10-CM

## 2023-03-04 DIAGNOSIS — R768 Other specified abnormal immunological findings in serum: Principal | ICD-10-CM

## 2023-03-24 ENCOUNTER — Ambulatory Visit: Admit: 2023-03-24 | Payer: PRIVATE HEALTH INSURANCE

## 2023-04-05 ENCOUNTER — Ambulatory Visit
Admit: 2023-04-05 | Discharge: 2023-04-05 | Disposition: A | Discharge status: Home / Self Care | Payer: PRIVATE HEALTH INSURANCE | Attending: Family

## 2023-04-05 ENCOUNTER — Emergency Department
Admit: 2023-04-05 | Discharge: 2023-04-05 | Disposition: A | Discharge status: Home / Self Care | Payer: PRIVATE HEALTH INSURANCE | Attending: Family

## 2023-04-05 DIAGNOSIS — S92424A Nondisplaced fracture of distal phalanx of right great toe, initial encounter for closed fracture: Principal | ICD-10-CM

## 2023-05-17 ENCOUNTER — Ambulatory Visit: Admit: 2023-05-17 | Payer: PRIVATE HEALTH INSURANCE

## 2023-07-01 ENCOUNTER — Ambulatory Visit: Payer: Medicaid Other

## 2023-07-13 ENCOUNTER — Ambulatory Visit
Admit: 2023-07-13 | Discharge: 2023-07-14 | Payer: PRIVATE HEALTH INSURANCE | Attending: Student in an Organized Health Care Education/Training Program | Primary: Student in an Organized Health Care Education/Training Program

## 2023-07-13 DIAGNOSIS — Z2821 Immunization not carried out because of patient refusal: Principal | ICD-10-CM

## 2023-07-13 DIAGNOSIS — F1290 Cannabis use, unspecified, uncomplicated: Principal | ICD-10-CM

## 2023-07-13 DIAGNOSIS — K7689 Other specified diseases of liver: Principal | ICD-10-CM

## 2023-07-13 DIAGNOSIS — F17200 Nicotine dependence, unspecified, uncomplicated: Principal | ICD-10-CM

## 2023-07-13 DIAGNOSIS — R768 Other specified abnormal immunological findings in serum: Principal | ICD-10-CM

## 2023-07-13 DIAGNOSIS — Z720 Tobacco use: Principal | ICD-10-CM

## 2023-07-13 DIAGNOSIS — E781 Pure hyperglyceridemia: Principal | ICD-10-CM

## 2023-07-13 DIAGNOSIS — K760 Fatty (change of) liver, not elsewhere classified: Principal | ICD-10-CM

## 2023-09-20 ENCOUNTER — Emergency Department
Admit: 2023-09-20 | Discharge: 2023-09-20 | Disposition: A | Discharge status: Home / Self Care | Payer: PRIVATE HEALTH INSURANCE | Attending: Student in an Organized Health Care Education/Training Program

## 2023-09-20 DIAGNOSIS — R42 Dizziness and giddiness: Principal | ICD-10-CM

## 2023-09-20 MED ORDER — MECLIZINE 25 MG TABLET
ORAL_TABLET | Freq: Three times a day (TID) | ORAL | 0 refills | 10.00 days | Status: CP | PRN
Start: 2023-09-20 — End: 2023-10-20

## 2023-09-20 MED ORDER — SALINE MIST 0.65 % NASAL SPRAY AEROSOL
Freq: Four times a day (QID) | NASAL | 0 refills | 14.00 days | Status: CP | PRN
Start: 2023-09-20 — End: 2023-10-04

## 2023-10-18 DIAGNOSIS — K835 Biliary cyst: Principal | ICD-10-CM

## 2023-12-13 ENCOUNTER — Ambulatory Visit
Admit: 2023-12-13 | Payer: Medicaid (Managed Care) | Attending: Student in an Organized Health Care Education/Training Program | Primary: Student in an Organized Health Care Education/Training Program

## 2023-12-30 ENCOUNTER — Ambulatory Visit: Admit: 2023-12-30 | Discharge: 2023-12-30 | Payer: Medicaid (Managed Care)

## 2023-12-30 ENCOUNTER — Ambulatory Visit
Admit: 2023-12-30 | Discharge: 2023-12-30 | Payer: Medicaid (Managed Care) | Attending: Internal Medicine | Primary: Internal Medicine

## 2023-12-30 DIAGNOSIS — K7689 Other specified diseases of liver: Principal | ICD-10-CM

## 2023-12-30 DIAGNOSIS — R768 Other specified abnormal immunological findings in serum: Principal | ICD-10-CM
